# Patient Record
Sex: Female | Born: 1990 | Race: White | Hispanic: No | Marital: Single | Smoking: Never smoker
Health system: Southern US, Community
[De-identification: ages and names within clinical notes are randomized; demographics above are authoritative.]

## PROBLEM LIST (undated history)

## (undated) DIAGNOSIS — E669 Obesity, unspecified: Secondary | ICD-10-CM

## (undated) DIAGNOSIS — I10 Essential (primary) hypertension: Secondary | ICD-10-CM

## (undated) HISTORY — PX: DILATION AND CURETTAGE OF UTERUS: SHX78

## (undated) HISTORY — PX: ABDOMINAL SURGERY: SHX537

## (undated) HISTORY — PX: TONSILLECTOMY: SUR1361

---
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
## 2008-05-03 ENCOUNTER — Emergency Department: Payer: Self-pay

## 2008-05-28 ENCOUNTER — Ambulatory Visit: Payer: Self-pay | Admitting: Obstetrics and Gynecology

## 2009-04-11 ENCOUNTER — Emergency Department: Payer: Self-pay | Admitting: Emergency Medicine

## 2009-05-11 ENCOUNTER — Emergency Department: Payer: Self-pay | Admitting: Emergency Medicine

## 2010-11-23 ENCOUNTER — Emergency Department: Payer: Self-pay | Admitting: Emergency Medicine

## 2011-03-21 ENCOUNTER — Emergency Department: Payer: Self-pay | Admitting: Emergency Medicine

## 2011-03-21 LAB — COMPREHENSIVE METABOLIC PANEL
Bilirubin,Total: 0.2 mg/dL (ref 0.2–1.0)
Calcium, Total: 8.7 mg/dL (ref 8.5–10.1)
Chloride: 104 mmol/L (ref 98–107)
Co2: 26 mmol/L (ref 21–32)
Creatinine: 0.47 mg/dL — ABNORMAL LOW (ref 0.60–1.30)
EGFR (African American): >60
EGFR (Non-African Amer.): >60
Glucose: 87 mg/dL (ref 65–99)
Osmolality: 279 (ref 275–301)
Potassium: 3.5 mmol/L (ref 3.5–5.1)
SGOT(AST): 21 U/L (ref 15–37)
Sodium: 141 mmol/L (ref 136–145)

## 2011-03-21 LAB — CBC
HGB: 13 g/dL (ref 12.0–16.0)
MCH: 27.6 pg (ref 26.0–34.0)
MCHC: 33 g/dL (ref 32.0–36.0)
MCV: 84 fL (ref 80–100)
Platelet: 251 10*3/uL (ref 150–440)
RDW: 13.7 % (ref 11.5–14.5)
WBC: 10.6 10*3/uL (ref 3.6–11.0)

## 2011-03-21 LAB — URINALYSIS, COMPLETE
Bacteria: NONE SEEN
Glucose,UR: NEGATIVE mg/dL (ref 0–75)
Ketone: NEGATIVE
Nitrite: NEGATIVE
Ph: 6 (ref 4.5–8.0)
Specific Gravity: 1.015 (ref 1.003–1.030)
Squamous Epithelial: 1

## 2011-03-21 LAB — HCG, QUANTITATIVE, PREGNANCY: Beta Hcg, Quant.: 4217 m[IU]/mL — ABNORMAL HIGH

## 2011-03-25 ENCOUNTER — Emergency Department: Payer: Self-pay | Admitting: Unknown Physician Specialty

## 2011-03-25 LAB — COMPREHENSIVE METABOLIC PANEL
Albumin: 3.5 g/dL (ref 3.4–5.0)
Alkaline Phosphatase: 65 U/L (ref 50–136)
Anion Gap: 9 (ref 7–16)
BUN: 10 mg/dL (ref 7–18)
Bilirubin,Total: 0.2 mg/dL (ref 0.2–1.0)
Calcium, Total: 8.9 mg/dL (ref 8.5–10.1)
Co2: 26 mmol/L (ref 21–32)
Creatinine: 0.64 mg/dL (ref 0.60–1.30)
Glucose: 84 mg/dL (ref 65–99)
Osmolality: 278 (ref 275–301)
Potassium: 4 mmol/L (ref 3.5–5.1)
Sodium: 140 mmol/L (ref 136–145)

## 2011-03-25 LAB — CBC
HGB: 13.7 g/dL (ref 12.0–16.0)
MCH: 27.8 pg (ref 26.0–34.0)
MCHC: 33.4 g/dL (ref 32.0–36.0)
Platelet: 246 10*3/uL (ref 150–440)
RBC: 4.92 10*6/uL (ref 3.80–5.20)
WBC: 11.3 10*3/uL — ABNORMAL HIGH (ref 3.6–11.0)

## 2011-03-25 LAB — HCG, QUANTITATIVE, PREGNANCY: Beta Hcg, Quant.: 9365 m[IU]/mL — ABNORMAL HIGH

## 2012-09-22 ENCOUNTER — Emergency Department: Payer: Self-pay | Admitting: Emergency Medicine

## 2015-05-06 ENCOUNTER — Ambulatory Visit
Admission: EM | Admit: 2015-05-06 | Discharge: 2015-05-06 | Disposition: A | Discharge status: Home / Self Care | Payer: Self-pay | Attending: Family Medicine | Admitting: Family Medicine

## 2015-05-06 ENCOUNTER — Encounter: Payer: Self-pay | Admitting: Emergency Medicine

## 2015-05-06 DIAGNOSIS — L03211 Cellulitis of face: Secondary | ICD-10-CM

## 2015-05-06 DIAGNOSIS — L0100 Impetigo, unspecified: Secondary | ICD-10-CM

## 2015-05-06 MED ORDER — MUPIROCIN CALCIUM 2 % EX CREA: 1.0000 "application " | TOPICAL_CREAM | Freq: Three times a day (TID) | CUTANEOUS | Status: DC

## 2015-05-06 MED ORDER — SULFAMETHOXAZOLE-TRIMETHOPRIM 800-160 MG PO TABS: 1.0000 | ORAL_TABLET | Freq: Two times a day (BID) | ORAL | Status: DC

## 2015-05-06 NOTE — ED Notes (Signed)
Pt reports bump to right side of face that started on Monday. Pt reports has had clear drainage denies known fever

## 2015-05-06 NOTE — ED Provider Notes (Signed)
CSN: [id number]     Arrival date & time 05/06/15  1017 History   First MD Initiated Contact with Patient 05/06/15 1231     Chief Complaint  Patient presents with  . Abscess   (Consider location/radiation/quality/duration/timing/severity/associated sxs/prior Treatment) HPI Comments: 25 yo female with a c/o 3 days of right sided cheek skin lesion with surrounding, spreading, redness, tenderness and warmth. States had close exposure niece who has impetigo.  Denies fevers, chills.   Patient is a 25 y.o. female presenting with abscess. The history is provided by the patient.  Abscess   No past medical history on file. No past surgical history on file. No family history on file. Social History  Substance Use Topics  . Smoking status: Never Smoker   . Smokeless tobacco: None  . Alcohol Use: No   OB History    No data available     Review of Systems  Allergies  Hydrocodone and Vicodin  Home Medications   Prior to Admission medications   Medication Sig Start Date End Date Taking? Authorizing Provider  Multiple Vitamin (MULTIVITAMIN) tablet Take 1 tablet by mouth daily.   Yes Historical Provider, MD  mupirocin cream (BACTROBAN) 2 % Apply 1 application topically 3 (three) times daily. 05/06/15   Payton Mccallum, MD  sulfamethoxazole-trimethoprim (BACTRIM DS,SEPTRA DS) 800-160 MG tablet Take 1 tablet by mouth 2 (two) times daily. 05/06/15   Payton Mccallum, MD   Meds Ordered and Administered this Visit  Medications - No data to display  BP 145/83 mmHg  Pulse 78  Temp(Src) 98.2 F (36.8 C) (Tympanic)  Resp 16  Ht  (1.626 m)  Wt 322 lb (146.058 kg)  BMI 55.24 kg/m2  SpO2 100% No data found.   Physical Exam  Constitutional: She appears well-developed and well-nourished. No distress.  Skin: She is not diaphoretic. There is erythema.  approx 1cm honey colored, crust lesion on right upper cheek area with spreading, surrounding skin blanchable erythema, warmth and tenderness to  palpation  Nursing note and vitals reviewed.   ED Course  Procedures (including critical care time)  Labs Review Labs Reviewed - No data to display  Imaging Review No results found.   Visual Acuity Review  Right Eye Distance:   Left Eye Distance:   Bilateral Distance:    Right Eye Near:   Left Eye Near:    Bilateral Near:         MDM   1. Impetigo   2. Cellulitis, face    Discharge Medication List as of 05/06/2015 12:43 PM    START taking these medications   Details  mupirocin cream (BACTROBAN) 2 % Apply 1 application topically 3 (three) times daily., Starting 05/06/2015, Until Discontinued, Normal    sulfamethoxazole-trimethoprim (BACTRIM DS,SEPTRA DS) 800-160 MG tablet Take 1 tablet by mouth 2 (two) times daily., Starting 05/06/2015, Until Discontinued, Normal       1. diagnosis reviewed with patient  2. rx as per orders above; reviewed possible side effects, interactions, risks and benefits  3. Recommend supportive treatment with warm compresses to area 4. Follow-up prn if symptoms worsen or don't improve    Payton Mccallum, MD 05/06/15 1255

## 2015-05-07 ENCOUNTER — Telehealth: Payer: Self-pay | Admitting: *Deleted

## 2015-05-07 NOTE — ED Notes (Signed)
Pharmacy on record called to have prescription for Bactroban changed to mupirocin ointment to allow coverage by Medicaid. Verbal given to change prescription. Pharmacy will call patient.

## 2016-10-09 ENCOUNTER — Ambulatory Visit
Admission: EM | Admit: 2016-10-09 | Discharge: 2016-10-09 | Disposition: A | Discharge status: Home / Self Care | Payer: Medicaid Other | Attending: Registered Nurse | Admitting: Registered Nurse

## 2016-10-09 DIAGNOSIS — H6593 Unspecified nonsuppurative otitis media, bilateral: Secondary | ICD-10-CM

## 2016-10-09 DIAGNOSIS — H9203 Otalgia, bilateral: Secondary | ICD-10-CM

## 2016-10-09 DIAGNOSIS — R05 Cough: Secondary | ICD-10-CM

## 2016-10-09 DIAGNOSIS — J069 Acute upper respiratory infection, unspecified: Secondary | ICD-10-CM

## 2016-10-09 DIAGNOSIS — B9789 Other viral agents as the cause of diseases classified elsewhere: Secondary | ICD-10-CM

## 2016-10-09 MED ORDER — PREDNISONE 50 MG PO TABS: 50.0000 mg | ORAL_TABLET | Freq: Every day | ORAL | 0 refills | Status: AC

## 2016-10-09 MED ORDER — SALINE SPRAY 0.65 % NA SOLN: 2.0000 | NASAL | 0 refills | Status: DC

## 2016-10-09 MED ORDER — IBUPROFEN 800 MG PO TABS: 800.0000 mg | ORAL_TABLET | Freq: Three times a day (TID) | ORAL | 0 refills | Status: DC

## 2016-10-09 MED ORDER — AMOXICILLIN-POT CLAVULANATE 875-125 MG PO TABS
1.0000 | ORAL_TABLET | Freq: Two times a day (BID) | ORAL | 0 refills | Status: DC
Start: 2016-10-09 — End: 2018-08-17

## 2016-10-09 MED ORDER — BENZONATATE 100 MG PO CAPS: 100.0000 mg | ORAL_CAPSULE | Freq: Three times a day (TID) | ORAL | 0 refills | Status: DC

## 2016-10-09 MED ORDER — FLUTICASONE PROPIONATE 50 MCG/ACT NA SUSP: 1.0000 | Freq: Two times a day (BID) | NASAL | 0 refills | Status: DC

## 2016-10-09 MED ORDER — ACETAMINOPHEN 500 MG PO TABS: 1000.0000 mg | ORAL_TABLET | Freq: Four times a day (QID) | ORAL | 0 refills | Status: DC | PRN

## 2016-10-09 NOTE — ED Provider Notes (Signed)
CSN: [id number]     Arrival date & time 10/09/16  0808 History   First MD Initiated Contact with Patient 10/09/16 [phone]     Chief Complaint  Patient presents with  . URI   (Consider location/radiation/quality/duration/timing/severity/associated sxs/prior Treatment) 25y/o single caucasian female established patient here for evaluation of cough productive green, wheezing x 2 days.  Denied seasonal allergies.  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  Patient requesting work excuse next scheduled shift tomorrow. Has been sweating more than normal.  Noted chest tenderness with coughing has tried her sons breathing treatment, alkaseltzer and nyquil for cough without relief.  Hasn't used flonase in many years siblings with asthma. Wednesday her normal scheduled off day.  Mother with patient today in exam room PMHX rosacea, obesity, hypertension, pregnancy        No past medical history on file. No past surgical history on file. No family history on file. Social History  Substance Use Topics  . Smoking status: Never Smoker  . Smokeless tobacco: Never Used  . Alcohol use No   OB History    No data available     Review of Systems  Constitutional: Positive for diaphoresis. Negative for activity change, appetite change, chills, fatigue, fever and unexpected weight change.  HENT: Negative for congestion, dental problem, drooling, ear discharge, ear pain, facial swelling, hearing loss, mouth sores, nosebleeds, postnasal drip, rhinorrhea, sinus pain, sinus pressure, sneezing, sore throat, tinnitus, trouble swallowing and voice change.   Eyes: Negative for photophobia, pain, discharge, redness, itching and visual disturbance.  Respiratory: Positive for cough and wheezing. Negative for choking, chest tightness, shortness of breath and stridor.   Cardiovascular: Negative for chest pain, palpitations and leg swelling.  Gastrointestinal: Negative for  abdominal distention, abdominal pain, blood in stool, diarrhea, nausea and vomiting.  Endocrine: Negative for cold intolerance and heat intolerance.  Genitourinary: Negative for difficulty urinating, dysuria and hematuria.  Musculoskeletal: Positive for joint swelling. Negative for arthralgias, back pain, gait problem, myalgias, neck pain and neck stiffness.  Skin: Negative for color change, pallor, rash and wound.  Allergic/Immunologic: Negative for environmental allergies and food allergies.  Neurological: Negative for dizziness, tremors, seizures, syncope, facial asymmetry, speech difficulty, weakness, light-headedness, numbness and headaches.  Hematological: Negative for adenopathy. Does not bruise/bleed easily.  Psychiatric/Behavioral: Negative for agitation, behavioral problems, confusion and sleep disturbance.    Allergies  Hydrocodone and Vicodin [hydrocodone-acetaminophen]  Home Medications   Prior to Admission medications   Medication Sig Start Date End Date Taking? Authorizing Provider  acetaminophen (TYLENOL) 500 MG tablet Take 2 tablets (1,000 mg total) by mouth every 6 (six) hours as needed. 10/09/16   Nathania Waldman, Jarold Song, NP  amoxicillin-clavulanate (AUGMENTIN) 875-125 MG tablet Take 1 tablet by mouth every 12 (twelve) hours. 10/09/16   Ringo Sherod, Jarold Song, NP  benzonatate (TESSALON) 100 MG capsule Take 1 capsule (100 mg total) by mouth every 8 (eight) hours. 10/09/16   Nicko Daher, Jarold Song, NP  fluticasone (FLONASE) 50 MCG/ACT nasal spray Place 1 spray into both nostrils 2 (two) times daily. 10/09/16 12/08/16  Chavon Lucarelli, Jarold Song, NP  ibuprofen (ADVIL,MOTRIN) 800 MG tablet Take 1 tablet (800 mg total) by mouth 3 (three) times daily. 10/09/16   Tyjay Galindo, Jarold Song, NP  predniSONE (DELTASONE) 50 MG tablet Take 1 tablet (50 mg total) by mouth daily with breakfast. 10/09/16 10/14/16  Danni Leabo, Jarold Song, NP  sodium chloride (OCEAN) 0.65 % SOLN nasal spray Place  2 sprays into both nostrils every 2  (two) hours while awake. 10/09/16 11/08/16  Colbey Wirtanen, Jarold Songina A, NP   Meds Ordered and Administered this Visit  Medications - No data to display  BP (!) 153/84 (BP Location: Left Arm)   Pulse 90   Temp 98.6 F (37 C) (Oral)   Resp (!) 24   Ht 5\' 4"  (1.626 m)   Wt (!) 345 lb (156.5 kg)   LMP 09/09/2016   SpO2 98%   BMI 59.22 kg/m  No data found.   Physical Exam  Constitutional: She is oriented to person, place, and time. She appears well-developed and well-nourished. She is active and cooperative.  Non-toxic appearance. She does not have a sickly appearance. She appears ill. No distress.  HENT:  Head: Normocephalic and atraumatic.  Right Ear: Hearing, external ear and ear canal normal. A middle ear effusion is present.  Left Ear: Hearing, external ear and ear canal normal. A middle ear effusion is present.  Nose: Mucosal edema and rhinorrhea present. No nose lacerations, sinus tenderness, nasal deformity, septal deviation or nasal septal hematoma. No epistaxis.  No foreign bodies. Right sinus exhibits no maxillary sinus tenderness and no frontal sinus tenderness. Left sinus exhibits no maxillary sinus tenderness and no frontal sinus tenderness.  Mouth/Throat: Uvula is midline and mucous membranes are normal. Mucous membranes are not pale, not dry and not cyanotic. She does not have dentures. No oral lesions. No trismus in the jaw. Normal dentition. No dental abscesses, uvula swelling, lacerations or dental caries. Posterior oropharyngeal edema and posterior oropharyngeal erythema present. No oropharyngeal exudate or tonsillar abscesses.  Oropharyngeal maculopapular rash erythema; bilateral TMS air fluid level slightly opacity; cobblestoning posterior pharynx; bilateral allergic shiners; bilateral nasal turbinates edema/erythema clear discharge  Eyes: Pupils are equal, round, and reactive to light. Conjunctivae, EOM and lids are normal. Right eye exhibits no chemosis, no discharge, no exudate  and no hordeolum. No foreign body present in the right eye. Left eye exhibits no chemosis, no discharge, no exudate and no hordeolum. No foreign body present in the left eye. Right conjunctiva is not injected. Right conjunctiva has no hemorrhage. Left conjunctiva is not injected. Left conjunctiva has no hemorrhage. No scleral icterus. Right eye exhibits normal extraocular motion and no nystagmus. Left eye exhibits normal extraocular motion and no nystagmus. Right pupil is round and reactive. Left pupil is round and reactive. Pupils are equal.  Neck: Trachea normal, normal range of motion, full passive range of motion without pain and phonation normal. Neck supple. No tracheal tenderness, no spinous process tenderness and no muscular tenderness present. No neck rigidity. No tracheal deviation, no edema, no erythema and normal range of motion present. No thyroid mass and no thyromegaly present.  Cardiovascular: Normal rate, regular rhythm, S1 normal, S2 normal, normal heart sounds and intact distal pulses.  PMI is not displaced.  Exam reveals no gallop and no friction rub.   No murmur heard. Pulses:      Radial pulses are 2+ on the right side, and 2+ on the left side.       Dorsalis pedis pulses are 2+ on the right side, and 2+ on the left side.  Left ankle/foot 1+/4 nonpitting edema  Pulmonary/Chest: Effort normal. No accessory muscle usage or stridor. No respiratory distress. She has no decreased breath sounds. She has wheezes in the right upper field. She has no rhonchi. She has no rales. She exhibits no tenderness.  With nonproductive cough RUF intermittent fine wheeze; able  to speak full sentences without difficulty  Abdominal: Soft. Normal appearance. She exhibits no distension. There is no rigidity and no guarding. No hernia. Hernia confirmed negative in the ventral area.  Musculoskeletal: Normal range of motion. She exhibits edema. She exhibits no deformity.       Right shoulder: Normal.        Left shoulder: Normal.       Right elbow: Normal.      Left elbow: Normal.       Right wrist: Normal.       Left wrist: Normal.       Right hip: Normal.       Left hip: Normal.       Right knee: Normal.       Left knee: Normal.       Right ankle: Normal.       Left ankle: She exhibits swelling. She exhibits normal range of motion, no ecchymosis, no deformity, no laceration and normal pulse.       Cervical back: Normal.       Thoracic back: Normal.       Lumbar back: She exhibits tenderness and pain. She exhibits normal range of motion, no bony tenderness, no swelling, no edema, no deformity, no laceration, no spasm and normal pulse.       Back:       Right forearm: Normal.       Left forearm: Normal.       Right hand: Normal.       Left hand: Normal.  Lymphadenopathy:       Head (right side): No submental, no submandibular, no tonsillar, no preauricular, no posterior auricular and no occipital adenopathy present.       Head (left side): No submental, no submandibular, no tonsillar, no preauricular, no posterior auricular and no occipital adenopathy present.    She has no cervical adenopathy.       Right cervical: No superficial cervical, no deep cervical and no posterior cervical adenopathy present.      Left cervical: No superficial cervical, no deep cervical and no posterior cervical adenopathy present.  Neurological: She is alert and oriented to person, place, and time. She has normal strength. She is not disoriented. She displays no atrophy and no tremor. No cranial nerve deficit or sensory deficit. She exhibits normal muscle tone. She displays no seizure activity. Coordination and gait normal. GCS eye subscore is 4. GCS verbal subscore is 5. GCS motor subscore is 6.  Skin: Skin is warm, dry and intact. Capillary refill takes less than 2 seconds. Rash noted. No abrasion, no bruising, no burn, no ecchymosis, no laceration, no lesion, no petechiae and no purpura noted. Rash is macular.  Rash is not papular, not maculopapular, not nodular, not pustular, not vesicular and not urticarial. She is not diaphoretic. There is erythema. No cyanosis. No pallor. Nails show no clubbing.     Macular erythema fine scale all face except chin denied sunburn stated "this is my rosacea"  Psychiatric: She has a normal mood and affect. Her speech is normal and behavior is normal. Judgment and thought content normal. She is not actively hallucinating. Cognition and memory are normal. She is attentive.  Nursing note and vitals reviewed. negative homan's sign; on/off exam table without difficulty; smooth gait in clinic.  Urgent Care Course     Procedures (including critical care time)  Labs Review Labs Reviewed - No data to display  Imaging Review No results found.  MDM   1. Viral URI with cough   2. Otitis media with effusion, bilateral    Rx prednisone 50mg  po daily with breakfast #5 RF0 take with food.  Discussed possible side effects with patient.  Rx augmentin 875mg  po BID x 10 days #20 RF0 if no improvement with 48 hours of prednisone and flonase 1 spray each nostril BID #1 RF0.  Nasal saline 2 sprays each nostril q2h wa.  Tessalon pearles 100mg  po TID prn cough #21 RF0.  Tylenol 1000mg  po QID prn pain or motrin 800mg  po TID prn pain/fever. Honey with lemon.  Delsym OTC.  Bronchitis simple, community acquired, may have started as viral (probably respiratory syncytial, parainfluenza, influenza, or adenovirus), but now evidence of acute purulent bronchitis with resultant bronchial edema and mucus formation.  Viruses are the most common cause of bronchial inflammation in otherwise healthy adults with acute bronchitis.  The appearance of sputum is not predictive of whether a bacterial infection is present.  Purulent sputum is most often caused by viral infections.  There are a small portion of those caused by non-viral agents being Mycoplamsa pneumonia.  Microscopic examination or C&S of  sputum in the healthy adult with acute bronchitis is generally not helpful (usually negative or normal respiratory flora) other considerations being cough from upper respiratory tract infections, sinusitis or allergic syndromes (mild asthma or viral pneumonia).  Differential Diagnosis:  reactive airway disease (asthma, allergic aspergillosis (eosinophilia), chronic bronchitis, respiratory infection (Sinusitis, Common cold, pneumonia), congestive heart failure, reflux esophagitis, bronchogenic tumor, aspiration syndromes and/or exposure irritants/tobacco smoke.  In this case, there is no evidence of any invasive bacterial illness.  Most likely viral etiology so will hold on antibiotic treatment.  Advise supportive care with rest, encourage fluids, good hygiene and watch for any worsening symptoms.  If they were to develop:  come back to the office or go to the emergency room if after hours. Without high fever, severe dyspnea, lack of physical findings or other risk factors, I will hold on a chest radiograph and CBC at this time.  I discussed that approximately 50% of patients with acute bronchitis have a cough that lasts up to three weeks, and 25% for over a month.  Tylenol, one to two tablets every four hours as needed for fever or myalgias.   No aspirin.  Patient instructed to follow up in one week or sooner if symptoms worsen. Patient verbalized agreement and understanding of treatment plan.  P2:  hand washing and cover cough  Supportive treatment.   No evidence of invasive bacterial infection, non toxic and well hydrated.  This is most likely self limiting viral infection.  I do not see where any further testing or imaging is necessary at this time.   I will suggest supportive care, rest, good hygiene and encourage the patient to take adequate fluids.  The patient is to return to clinic or EMERGENCY ROOM if symptoms worsen or change significantly e.g. ear pain, fever, purulent discharge from ears or bleeding.   Exitcare handout on otitis media with effusion given to patient.  Patient verbalized agreement and understanding of treatment plan.       Barbaraann BarthelBetancourt, Skylen Spiering A, NP 10/09/16 [phone]

## 2016-10-09 NOTE — ED Triage Notes (Signed)
Pt is c/o URI sx x 4 days. She is c/o dry and productive cough of green sputum, wheezing, dyspnea, chest tightness, sore neck and abdomen/chest from coughing, some drainage. Pt has felt feverish, but no recorded temperatures. She also has rib pain, and states "it feels like I have pneumonia". 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

## 2018-08-17 ENCOUNTER — Ambulatory Visit
Admission: EM | Admit: 2018-08-17 | Discharge: 2018-08-17 | Disposition: A | Discharge status: Home / Self Care | Payer: Self-pay | Attending: Family Medicine | Admitting: Family Medicine

## 2018-08-17 ENCOUNTER — Other Ambulatory Visit: Payer: Self-pay

## 2018-08-17 ENCOUNTER — Encounter: Payer: Self-pay | Admitting: Emergency Medicine

## 2018-08-17 DIAGNOSIS — R198 Other specified symptoms and signs involving the digestive system and abdomen: Secondary | ICD-10-CM

## 2018-08-17 MED ORDER — MUPIROCIN 2 % EX OINT: 1.0000 "application " | TOPICAL_OINTMENT | Freq: Two times a day (BID) | CUTANEOUS | 0 refills | Status: AC

## 2018-08-17 MED ORDER — DOXYCYCLINE HYCLATE 100 MG PO CAPS: 100.0000 mg | ORAL_CAPSULE | Freq: Two times a day (BID) | ORAL | 0 refills | Status: DC

## 2018-08-17 NOTE — ED Provider Notes (Signed)
[facility]    CSN: [id number] Arrival date & time: 08/17/18  1538  History   Chief Complaint Chief Complaint  Patient presents with  . Abdominal Pain    (APPT) Umbilicus   HPI  28 year old female presents with pain of her umbilicus.  Patient reports a 2-day history of pain inside her umbilicus.  She reports that this morning she awoke and the area was bleeding.  It is now draining.  Appears to be draining pus.  Mild itching.  No known inciting factor.  No known exacerbating or relieving factors.  No fever.  No other associated symptoms.  No other complaints.  PMH, Surgical Hx, Family Hx, Social History reviewed and updated as below.  PMH: Depression, Morbid obesity  Past Surgical History:  Procedure Laterality Date  . ABDOMINAL SURGERY    . DILATION AND CURETTAGE OF UTERUS    . TONSILLECTOMY      OB History   No obstetric history on file.    Home Medications    Prior to Admission medications   Medication Sig Start Date End Date Taking? Authorizing Provider  acetaminophen (TYLENOL) 500 MG tablet Take 2 tablets (1,000 mg total) by mouth every 6 (six) hours as needed. 10/09/16  Yes Betancourt, Jarold Songina A, NP  doxycycline (VIBRAMYCIN) 100 MG capsule Take 1 capsule (100 mg total) by mouth 2 (two) times daily. 08/17/18   Tommie Samsook, Delvis Kau G, DO  mupirocin ointment (BACTROBAN) 2 % Apply 1 application topically 2 (two) times daily for 7 days. 08/17/18 08/24/18  Tommie Samsook, Laurel Harnden G, DO   Family History Family History  Problem Relation Age of Onset  . Crohn's disease Mother   . COPD Father   . Heart failure Father    Social History Social History   Tobacco Use  . Smoking status: Never Smoker  . Smokeless tobacco: Never Used  Substance Use Topics  . Alcohol use: No  . Drug use: Not Currently   Allergies   Hydrocodone and Vicodin [hydrocodone-acetaminophen]   Review of Systems Review of Systems  Constitutional: Negative for fever.  Skin:       Pain inside umbilicus;  drainage.   Physical Exam Triage Vital Signs ED Triage Vitals  Enc Vitals Group     BP 08/17/18 1610 (!) 144/84     Pulse Rate 08/17/18 1610 75     Resp 08/17/18 1610 18     Temp 08/17/18 1610 98.7 F (37.1 C)     Temp Source 08/17/18 1610 Oral     SpO2 08/17/18 1610 98 %     Weight 08/17/18 1605 (!) 350 lb (158.8 kg)     Height 08/17/18 1605 5\' 4"  (1.626 m)     Head Circumference --      Peak Flow --      Pain Score 08/17/18 1605 7     Pain Loc --      Pain Edu? --      Excl. in GC? --    Updated Vital Signs BP (!) 144/84 (BP Location: Right Arm)   Pulse 75   Temp 98.7 F (37.1 C) (Oral)   Resp 18   Ht 5\' 4"  (1.626 m)   Wt (!) 158.8 kg   LMP 06/15/2018 (Approximate)   SpO2 98%   BMI 60.08 kg/m   Visual Acuity Right Eye Distance:   Left Eye Distance:   Bilateral Distance:    Right Eye Near:   Left Eye Near:    Bilateral Near:  Physical Exam Vitals signs and nursing note reviewed.  Constitutional:      General: She is not in acute distress.    Appearance: She is well-developed. She is obese.  HENT:     Head: Normocephalic and atraumatic.  Eyes:     General:        Right eye: No discharge.        Left eye: No discharge.     Conjunctiva/sclera: Conjunctivae normal.  Cardiovascular:     Rate and Rhythm: Normal rate and regular rhythm.  Pulmonary:     Effort: Pulmonary effort is normal.     Breath sounds: Normal breath sounds.  Skin:    Comments: Erythema noted inside the umbilicus and purulent discharge noted.  Neurological:     Mental Status: She is alert.  Psychiatric:        Behavior: Behavior normal.     Comments: Flat affect.    UC Treatments / Results  Labs (all labs ordered are listed, but only abnormal results are displayed) Labs Reviewed - No data to display  EKG None  Radiology No results found.  Procedures Procedures (including critical care time)  Medications Ordered in UC Medications - No data to display  Initial  Impression / Assessment and Plan / UC Course  I have reviewed the triage vital signs and the nursing notes.  Pertinent labs & imaging results that were available during my care of the patient were reviewed by me and considered in my medical decision making (see chart for details).    28 year old female presents with infection of her umbilicus.  Treating with doxycycline and Bactroban ointment.  Final Clinical Impressions(s) / UC Diagnoses   Final diagnoses:  Umbilicus discharge     Discharge Instructions     Antibiotics as prescribed.  Take care  Dr. Adriana Simas    ED Prescriptions    Medication Sig Dispense Auth. Provider   doxycycline (VIBRAMYCIN) 100 MG capsule Take 1 capsule (100 mg total) by mouth 2 (two) times daily. 14 capsule Mishel Sans G, DO   mupirocin ointment (BACTROBAN) 2 % Apply 1 application topically 2 (two) times daily for 7 days. 22 g Tommie Sams, DO     Controlled Substance Prescriptions [place] Controlled Substance Registry consulted? Not Applicable   Tommie Sams, DO 08/17/18 1702

## 2018-08-17 NOTE — Discharge Instructions (Signed)
Antibiotics as prescribed.  Take care  Dr. Vanetta Rule  

## 2018-08-17 NOTE — ED Triage Notes (Signed)
Pt c/o pain in her umbilicus. Started about 3 days ago. She states this morning when she woke up it was bleeding. She states that it was itching this morning.

## 2018-11-20 ENCOUNTER — Other Ambulatory Visit: Payer: Self-pay

## 2018-11-20 DIAGNOSIS — Z20822 Contact with and (suspected) exposure to covid-19: Secondary | ICD-10-CM

## 2018-11-22 LAB — NOVEL CORONAVIRUS, NAA: SARS-CoV-2, NAA: NOT DETECTED

## 2019-04-01 ENCOUNTER — Other Ambulatory Visit: Payer: Self-pay

## 2019-04-01 ENCOUNTER — Ambulatory Visit
Admission: EM | Admit: 2019-04-01 | Discharge: 2019-04-01 | Disposition: A | Discharge status: Home / Self Care | Payer: Medicaid Other | Attending: Emergency Medicine | Admitting: Emergency Medicine

## 2019-04-01 DIAGNOSIS — B349 Viral infection, unspecified: Secondary | ICD-10-CM | POA: Diagnosis not present

## 2019-04-01 DIAGNOSIS — Z7189 Other specified counseling: Secondary | ICD-10-CM | POA: Diagnosis present

## 2019-04-01 DIAGNOSIS — R519 Headache, unspecified: Secondary | ICD-10-CM

## 2019-04-01 DIAGNOSIS — R110 Nausea: Secondary | ICD-10-CM

## 2019-04-01 DIAGNOSIS — Z20822 Contact with and (suspected) exposure to covid-19: Secondary | ICD-10-CM | POA: Diagnosis present

## 2019-04-01 DIAGNOSIS — R0981 Nasal congestion: Secondary | ICD-10-CM

## 2019-04-01 DIAGNOSIS — R5383 Other fatigue: Secondary | ICD-10-CM

## 2019-04-01 HISTORY — DX: Obesity, unspecified: E66.9

## 2019-04-01 HISTORY — DX: Essential (primary) hypertension: I10

## 2019-04-01 NOTE — ED Triage Notes (Addendum)
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 Work wants her checked for COVID.

## 2019-04-01 NOTE — Discharge Instructions (Signed)
It was very nice seeing you today in clinic. Thank you for entrusting me with your care.   Rest and Stay HYDRATED. Water and electrolyte containing beverages (Gatorade, Pedialyte) are best to prevent dehydration and electrolyte abnormalities. May use Tylenol and/or Ibuprofen as needed for pain.   You were tested for SARS-CoV-2 (novel coronavirus) today. Testing is performed by an outside lab ([facility]) and has variable turn around times ranging between 2-5 days. Current recommendations from the the CDC and [place] DHHS require that you remain out of work in order to quarantine at home until negative test results are have been received. In the event that your test results are positive, you will be contacted with further directives. These measures are being implemented out of an abundance of caution to prevent transmission and spread during the current SARS-CoV-2 pandemic.  Make arrangements to follow up with your regular doctor in 1 week for re-evaluation if not improving. If your symptoms/condition worsens, please seek follow up care either here or in the ER. Please remember, our [facility] providers are "right here with you" when you need [place].   Again, it was my pleasure to take care of you today. Thank you for choosing our clinic. I hope that you start to feel better quickly.   Quentin Mulling, MSN, APRN, FNP-C, CEN Advanced Practice Provider [facility]

## 2019-04-02 ENCOUNTER — Other Ambulatory Visit: Payer: Medicaid Other

## 2019-04-02 LAB — NOVEL CORONAVIRUS, NAA (HOSP ORDER, SEND-OUT TO REF LAB; TAT 18-24 HRS): SARS-CoV-2, NAA: NOT DETECTED

## 2019-04-03 NOTE — ED Provider Notes (Signed)
[place] [place]   Name: Alexandria Black DOB: [birth year] MRN: [id number] CSN: [id number] PCP: Patient, No Pcp Per  Arrival date and time:  04/01/19 1531  Chief Complaint:  Headache and Sore Throat   NOTE: Prior to seeing the patient today, I have reviewed the triage nursing documentation and vital signs. Clinical staff has updated patient's PMH/PSHx, current medication list, and drug allergies/intolerances to ensure comprehensive history available to assist in medical decision making.   History:   HPI: Alexandria Black is a 29 y.o. female who presents today with complaints of fatigue, congestion, rhinorrhea, and a generalized headache that started approximately 3 days ago. Patient denies fevers. She denies any cough, shortness of breath, or wheezing. She has had some mild nausea, but denies vomiting, diarrhea, or abdominal pain. She is eating and drinking well. Patient denies any perceived alterations to her sense of taste or smell. Patient presents out of concerns for her personal health due to her progressive symptoms. She notes that her nieces and nephews have been ill with similar symptoms, however all of them have tested negative for SARS-CoV-2 (novel coronavirus). No one else is her home has experienced a similar symptom constellation. She has not been tested for SARS-CoV-2 (novel coronavirus) in the past 14 days; last tested negative on 11/20/2018 per her report. Patient has been vaccinated for influenza this season. In efforts to conservatively manage her symptoms at home, the patient notes that she has used IBU and APAP, which have helped to improve her symptoms some.  Past Medical History:  Diagnosis Date  . Hypertension   . Obesity     Past Surgical History:  Procedure Laterality Date  . ABDOMINAL SURGERY    . DILATION AND CURETTAGE OF UTERUS    . TONSILLECTOMY      Family History  Problem Relation Age of Onset  . Crohn's disease Mother   . COPD Father   . Heart failure  Father     Social History   Tobacco Use  . Smoking status: Never Smoker  . Smokeless tobacco: Never Used  Substance Use Topics  . Alcohol use: No  . Drug use: Not Currently    There are no problems to display for this patient.   Home Medications:    No outpatient medications have been marked as taking for the 04/01/19 encounter [employer] Encounter).    Allergies:   Hydrocodone and Vicodin [hydrocodone-acetaminophen]  Review of Systems (ROS): Review of Systems  Constitutional: Positive for fatigue. Negative for fever.  HENT: Positive for congestion and rhinorrhea. Negative for ear pain, postnasal drip, sinus pressure, sinus pain, sneezing and sore throat.   Eyes: Negative for pain, discharge and redness.  Respiratory: Negative for cough, chest tightness and shortness of breath.   Cardiovascular: Negative for chest pain and palpitations.  Gastrointestinal: Positive for nausea. Negative for abdominal pain, diarrhea and vomiting.  Musculoskeletal: Negative for arthralgias, back pain, myalgias and neck pain.  Skin: Negative for color change, pallor and rash.  Neurological: Positive for headaches. Negative for dizziness, syncope and weakness.  Hematological: Negative for adenopathy.     Vital Signs: Today's Vitals   04/01/19 1623 04/01/19 1625  BP: (!) 150/92   Pulse: 92   Resp: 20   Temp: 98.1 F (36.7 C)   TempSrc: Oral   SpO2: 99%   Weight:  (!) 355 lb (161 kg)  Height:  5\' 4"  (1.626 m)  PainSc: 4      Physical Exam: Physical Exam  Constitutional: She  is oriented to person, place, and time and well-developed, well-nourished, and in no distress.  HENT:  Head: Normocephalic and atraumatic.  Nose: Rhinorrhea present. No sinus tenderness.  Mouth/Throat: Uvula is midline and mucous membranes are normal. Posterior oropharyngeal erythema (mild with (+) clear PND) present. No oropharyngeal exudate or posterior oropharyngeal edema.  Eyes: Pupils are equal, round, and  reactive to light.  Cardiovascular: Normal rate, regular rhythm, normal heart sounds and intact distal pulses.  Pulmonary/Chest: Effort normal and breath sounds normal.  Neurological: She is alert and oriented to person, place, and time. Gait normal.  Skin: Skin is warm and dry. No rash noted. She is not diaphoretic.  Psychiatric: Mood, memory, affect and judgment normal.  Nursing note and vitals reviewed.   Urgent Care Treatments / Results:   Orders Placed This Encounter  Procedures  . Novel Coronavirus, NAA (Hosp order, Send-out to Ref Lab; TAT 18-24 hrs    LABS: PLEASE NOTE: all labs that were ordered this encounter are listed, however only abnormal results are displayed. Labs Reviewed  NOVEL CORONAVIRUS, NAA (HOSP ORDER, SEND-OUT TO REF LAB; TAT 18-24 HRS)    EKG: -None  RADIOLOGY: No results found.  PROCEDURES: Procedures  MEDICATIONS RECEIVED THIS VISIT: Medications - No data to display  PERTINENT CLINICAL COURSE NOTES/UPDATES:   Initial Impression / Assessment and Plan / Urgent Care Course:  Pertinent labs & imaging results that were available during my care of the patient were personally reviewed by me and considered in my medical decision making (see lab/imaging section of note for values and interpretations).  Alexandria Black is a 29 y.o. female who presents to [facility] today with complaints of Headache and Sore Throat  Patient overall well appearing and in no acute distress today in clinic. Presenting symptoms (see HPI) and exam as documented above. She presents with symptoms associated with SARS-CoV-2 (novel coronavirus). Discussed typical symptom constellation. Reviewed potential for infection and need for testing. Patient amenable to being tested. SARS-CoV-2 swab collected by certified clinical staff. Discussed variable turn around times associated with testing, as swabs are being processed at [facility], and have been taking between 24-48 hours to come  back. She was advised to self quarantine, per [facility] DHHS guidelines, until negative results received. These measures are being implemented out of an abundance of caution to prevent transmission and spread during the current SARS-CoV-2 pandemic.  Presenting symptoms consistent with acute viral illness. Until ruled out with confirmatory lab testing, SARS-CoV-2 remains part of the differential. Her testing is pending at this time. I discussed with her that her symptoms are felt to be viral in nature, thus antibiotics would not offer her any relief or improve his symptoms any faster than conservative symptomatic management. Discussed supportive care measures at home during acute phase of illness. Patient to rest as much as possible. She was encouraged to ensure adequate hydration (water and ORS) to prevent dehydration and electrolyte derangements. Patient may use APAP and/or IBU on an as needed basis for pain/fever.    Discussed follow up with primary care physician in 1 week for re-evaluation. I have reviewed the follow up and strict return precautions for any new or worsening symptoms. Patient is aware of symptoms that would be deemed urgent/emergent, and would thus require further evaluation either here or in the emergency department. At the time of discharge, she verbalized understanding and consent with the discharge plan as it was reviewed with her. All questions were fielded by provider and/or clinic staff prior  to patient discharge.    Final Clinical Impressions / Urgent Care Diagnoses:   Final diagnoses:  Viral illness  Encounter for laboratory testing for COVID-19 virus  Advice given about COVID-19 virus infection    New Prescriptions:  [place] Controlled Substance Registry consulted? Not Applicable  No orders of the defined types were placed in this encounter.   Recommended Follow up Care:  Patient encouraged to follow up with the following provider within the specified time frame, or sooner as  dictated by the severity of her symptoms. As always, she was instructed that for any urgent/emergent care needs, she should seek care either here or in the emergency department for more immediate evaluation.  Follow-up Information    PCP In 1 week.   Why: General reassessment of symptoms if not improving        NOTE: This note was prepared using Lobbyist along with smaller Company secretary. Despite my best ability to proofread, there is the potential that transcriptional errors may still occur from this process, and are completely unintentional.    Karen Kitchens, NP 04/03/19 1442

## 2019-04-15 ENCOUNTER — Other Ambulatory Visit: Payer: Medicaid Other

## 2019-04-24 ENCOUNTER — Ambulatory Visit
Admission: EM | Admit: 2019-04-24 | Discharge: 2019-04-24 | Disposition: A | Discharge status: Home / Self Care | Payer: HRSA Program | Attending: Urgent Care | Admitting: Urgent Care

## 2019-04-24 ENCOUNTER — Ambulatory Visit: Payer: Self-pay

## 2019-04-24 ENCOUNTER — Other Ambulatory Visit: Payer: Self-pay

## 2019-04-24 ENCOUNTER — Ambulatory Visit
Admission: RE | Admit: 2019-04-24 | Discharge: 2019-04-24 | Disposition: A | Discharge status: Home / Self Care | Payer: Medicaid Other | Source: Ambulatory Visit

## 2019-04-24 DIAGNOSIS — U071 COVID-19: Secondary | ICD-10-CM

## 2019-04-24 DIAGNOSIS — J1282 Pneumonia due to coronavirus disease 2019: Secondary | ICD-10-CM | POA: Insufficient documentation

## 2019-04-24 DIAGNOSIS — R079 Chest pain, unspecified: Secondary | ICD-10-CM

## 2019-04-24 DIAGNOSIS — R05 Cough: Secondary | ICD-10-CM | POA: Insufficient documentation

## 2019-04-24 DIAGNOSIS — R059 Cough, unspecified: Secondary | ICD-10-CM

## 2019-04-24 DIAGNOSIS — R0602 Shortness of breath: Secondary | ICD-10-CM

## 2019-04-24 MED ORDER — HYDROCOD POLST-CPM POLST ER 10-8 MG/5ML PO SUER: 5.0000 mL | Freq: Two times a day (BID) | ORAL | 0 refills | Status: DC | PRN

## 2019-04-24 MED ORDER — PREDNISONE 20 MG PO TABS: 40.0000 mg | ORAL_TABLET | Freq: Every day | ORAL | 0 refills | Status: DC

## 2019-04-24 NOTE — ED Provider Notes (Signed)
Virtual Visit via Video Note:  Alexandria Black  initiated request for Telemedicine visit with [facility] team. I connected with Alexandria Black  on 04/24/2019 at 10:58 AM  for a synchronized telemedicine visit using a video enabled HIPPA compliant telemedicine application. I verified that I am speaking with Alexandria Black  using two identifiers. Janace Aris, NP  was physically located in a [facility]gent care site and IYA HAMED was located at a different location.   The limitations of evaluation and management by telemedicine as well as the availability of in-person appointments were discussed. Patient was informed that she  may incur a bill ( including co-pay) for this virtual visit encounter. Lilley N Bernier  expressed understanding and gave verbal consent to proceed with virtual visit.     History of Present Illness:Alexandria Black  is a 29 y.o. female presents with chest pain with taking a deep breath and SOB. SOB is worse at night and with exertion. Mostly dry cough. She was dx with COVID 19 on the 29th of January. Feels as if her symptoms are worsening. No fever.   Past Medical History:  Diagnosis Date   Hypertension    Obesity     Allergies  Allergen Reactions   Hydrocodone    Vicodin [Hydrocodone-Acetaminophen]         Observations/Objective: VITALS: Per patient if applicable, see vitals. GENERAL: Alert, appears well and in no acute distress. HEENT: Atraumatic, conjunctiva clear, no obvious abnormalities on inspection of external nose and ears. NECK: Normal movements of the head and neck. CARDIOPULMONARY: No increased WOB. Speaking in clear sentences. I:E ratio WNL.  MS: Moves all visible extremities without noticeable abnormality. PSYCH: Pleasant and cooperative, well-groomed. Speech normal rate and rhythm. Affect is appropriate. Insight and judgement are appropriate. Attention is focused, linear, and appropriate.  NEURO: CN grossly intact. Oriented as  arrived to appointment on time with no prompting. Moves both UE equally.  SKIN: No obvious lesions, wounds, erythema, or cyanosis noted on face or hands.    Assessment and Plan: pt in no acute distress on camera. My concern is the chest pain with breathing and SOB with dx of COVID 19. She needs to be evaluated in person for possible x ray and vitals check to r/o hypoxia.  Pt understanding and agreed to plan.     Follow Up Instructions: go to the [facility] to be evaluated.     I discussed the assessment and treatment plan with the patient. The patient was provided an opportunity to ask questions and all were answered. The patient agreed with the plan and demonstrated an understanding of the instructions.   The patient was advised to call back or seek an in-person evaluation if the symptoms worsen or if the condition fails to improve as anticipated.    Janace Aris, NP  04/24/2019 10:58 AM         Janace Aris, NP 04/24/19 1114

## 2019-04-24 NOTE — ED Provider Notes (Signed)
[place], [place]   Name: Alexandria Black DOB: Nov 17, 1990 MRN: [id number] CSN: [id number] PCP: Patient, No Pcp Per  Arrival date and time:  04/24/19 1142  Chief Complaint:  Shortness of Breath   NOTE: Prior to seeing the patient today, I have reviewed the triage nursing documentation and vital signs. Clinical staff has updated patient's PMH/PSHx, current medication list, and drug allergies/intolerances to ensure comprehensive history available to assist in medical decision making.   History:   HPI: Alexandria Black is a 29 y.o. female who presents today with complaints of cough, shortness of breath, and pleuritic chest pain for the last 2 weeks. Patient denies and fevers. She has not experienced any wheezing. Cough reported as being non-productive and worse at night. Patient reporting that she was diagnosed with SARS-CoV-2 (novel coronavirus) on 04/15/2019. With the exception of the aforementioned symptoms, patient reports that she has improved since being diagnosed with SARS-CoV-2. She is no longer having headaches, nausea, vomiting, and body aches. Patient continues to experience anosmia and ageusia. In efforts to conservatively manage her symptoms at home, the patient notes that she has used Nyquil, Dayquil, and IBU, which has not helped to improve her symptoms. Patient with no increased work of breathing or respiratory distress observed in clinic. She is able to speak in complete sentences without difficulties. Presenting oxygen saturations 98% on room air. Patient participated in a tele-health visit today with Jaci Lazier, NP; notes reviewed. Due to her symptoms, it was recommended that patient present for F2F evaluation and +/- imaging of her chest.   Past Medical History:  Diagnosis Date  . Hypertension   . Obesity     Past Surgical History:  Procedure Laterality Date  . ABDOMINAL SURGERY    . DILATION AND CURETTAGE OF UTERUS    . TONSILLECTOMY      Family History  Problem Relation Age of  Onset  . Crohn's disease Mother   . COPD Father   . Heart failure Father     Social History   Tobacco Use  . Smoking status: Never Smoker  . Smokeless tobacco: Never Used  Substance Use Topics  . Alcohol use: No  . Drug use: Not Currently    There are no problems to display for this patient.   Home Medications:    No outpatient medications have been marked as taking for the 04/24/19 encounter [facility] Encounter).    Allergies:   Hydrocodone and Vicodin [hydrocodone-acetaminophen]  Review of Systems (ROS): Review of Systems  Constitutional: Negative for fatigue and fever.  HENT: Negative for congestion, ear pain, postnasal drip, rhinorrhea, sinus pressure, sinus pain, sneezing and sore throat.        (+) anosmia and ageusia  Eyes: Negative for pain, discharge and redness.  Respiratory: Positive for cough and shortness of breath. Negative for chest tightness.   Cardiovascular: Positive for chest pain (pleuritic). Negative for palpitations.  Gastrointestinal: Negative for abdominal pain, diarrhea, nausea and vomiting.  Musculoskeletal: Negative for arthralgias, back pain, myalgias and neck pain.  Skin: Negative for color change, pallor and rash.  Neurological: Negative for dizziness, syncope, weakness and headaches.  Hematological: Negative for adenopathy.  Psychiatric/Behavioral: Positive for sleep disturbance.     Vital Signs: Today's Vitals   04/24/19 1158 04/24/19 1200 04/24/19 1303  BP:  (!) 147/76   Pulse:  84   Temp:  98.7 F (37.1 C)   TempSrc:  Oral   SpO2:  98%   Weight: (!) 355 lb (161 kg)  Height: 5\' 4"  (1.626 m)    PainSc: 7   7     Physical Exam: Physical Exam  Constitutional: She is oriented to person, place, and time and well-developed, well-nourished, and in no distress.  HENT:  Head: Normocephalic and atraumatic.  Nose: Nose normal.  Mouth/Throat: Uvula is midline and mucous membranes are normal. Posterior oropharyngeal erythema (mild)  present. No oropharyngeal exudate or posterior oropharyngeal edema.  Eyes: Pupils are equal, round, and reactive to light.  Cardiovascular: Normal rate, regular rhythm, normal heart sounds and intact distal pulses.  Pulmonary/Chest: Effort normal. She has decreased breath sounds in the right middle field and the left upper field. She has rhonchi (scattered; clears with cough). She exhibits no tenderness.  Moderate cough noted in clinic. No SOB or increased WOB. No distress. Able to speak in complete sentences without difficulties. SPO2 98% on RA.  Neurological: She is alert and oriented to person, place, and time. Gait normal.  Skin: Skin is warm and dry. No rash noted. She is not diaphoretic.  Psychiatric: Mood, memory, affect and judgment normal.  Nursing note and vitals reviewed.   Urgent Care Treatments / Results:   Orders Placed This Encounter  Procedures  . DG Chest 2 View    LABS: PLEASE NOTE: all labs that were ordered this encounter are listed, however only abnormal results are displayed. Labs Reviewed - No data to display  EKG: -None  RADIOLOGY: DG Chest 2 View  Result Date: 04/24/2019 CLINICAL DATA:  Cough. Dyspnea on exertion. SARS-CoV-2 positive 04/15/2019 EXAM: CHEST - 2 VIEW COMPARISON:  Radiograph 10/30/2007 FINDINGS: The cardiomediastinal contours are normal. Minimal vague patchy opacities in the mid lower lung zones bilaterally. Pulmonary vasculature is normal. No pleural effusion or pneumothorax. No acute osseous abnormalities are seen. IMPRESSION: Minimal vague patchy opacities in the mid lower lung zones bilaterally, suspicious for atypical viral pneumonia. Radiographic findings are mild. Electronically Signed   By: 11/01/2007 M.D.   On: 04/24/2019 12:41    PROCEDURES: Procedures  MEDICATIONS RECEIVED THIS VISIT: Medications - No data to display  PERTINENT CLINICAL COURSE NOTES/UPDATES:   Initial Impression / Assessment and Plan / Urgent Care Course:   Pertinent labs & imaging results that were available during my care of the patient were personally reviewed by me and considered in my medical decision making (see lab/imaging section of note for values and interpretations).  Alexandria Black is a 29 y.o. female who presents to [facility] today with complaints of Shortness of Breath  Patient is well appearing overall in clinic today. She does not appear to be in any acute distress. Presenting symptoms (see HPI) and exam as documented above. Progressive cough and shortness of breath following SARS-CoV-2 (novel coronavirus) on 04/16/2019. No fevers. Radiographs of the chest performed today reveal patchy opacities BILATERALLY consistent with SARS-CoV-2 pneumonia. I discussed with her that her symptoms are felt to be viral in nature, thus antibiotics would not offer her any relief or improve her symptoms any faster than conservative symptomatic management. Will treat with systemic steroids to help with cough and pleuritic chest pain. Cough is significant and preventing sleep. Will send in a supply of Tussionex for PRN use. She was educated on the indications and associated side effects of this medication. Discussed supportive care measures at home during acute phase of illness. Patient to rest as much as possible. She was encouraged to ensure adequate hydration (water and ORS) to prevent dehydration and electrolyte derangements. Patient may use APAP  and/or IBU on an as needed basis for pain/fever. Patient remains out of work due to required SARS-CoV-2 quarantine at this time.   Discussed follow up with primary care physician in 1 week for re-evaluation. I have reviewed the follow up and strict return precautions for any new or worsening symptoms. Patient is aware of symptoms that would be deemed urgent/emergent, and would thus require further evaluation either here or in the emergency department. At the time of discharge, she verbalized understanding and  consent with the discharge plan as it was reviewed with her. All questions were fielded by provider and/or clinic staff prior to patient discharge.    Final Clinical Impressions / Urgent Care Diagnoses:   Final diagnoses:  Pneumonia due to COVID-19 virus  SOB (shortness of breath)  Cough    New Prescriptions:  [place] Controlled Substance Registry consulted? Yes, I have consulted the [place] Controlled Substances Registry for this patient, and feel the risk/benefit ratio today is favorable for proceeding with this prescription for a controlled substance.  . Discussed use of controlled substance medication to treat her acute symptoms.  o Reviewed [place] STOP Act regulations  o Clinic does not refill controlled substances over the phone without face to face evaluation.  . Safety precautions reviewed.  o Medications should not be sold or taken with alcohol.  o Avoid use while working, driving, or operating heavy machinery.  o Side effects associated with the use of this particular medication reviewed. - Patient understands that this medication can cause CNS depression, increase her risk of falls, and even lead to overdose that may result in death, if used outside of the parameters that she and I discussed.  With all of this in mind, she knowingly accepts the risks and responsibilities associated with intended course of treatment, and elects to responsibly proceed as discussed.  Meds ordered this encounter  Medications  . chlorpheniramine-HYDROcodone (TUSSIONEX PENNKINETIC ER) 10-8 MG/5ML SUER    Sig: Take 5 mLs by mouth every 12 (twelve) hours as needed for cough. Will causes drowsiness; NO DRIVING.    Dispense:  70 mL    Refill:  0  . predniSONE (DELTASONE) 20 MG tablet    Sig: Take 2 tablets (40 mg total) by mouth daily.    Dispense:  10 tablet    Refill:  0    Recommended Follow up Care:  Patient encouraged to follow up with the following provider within the specified time frame, or sooner as  dictated by the severity of her symptoms. As always, she was instructed that for any urgent/emergent care needs, she should seek care either here or in the emergency department for more immediate evaluation.  Follow-up Information    PCP In 1 week.   Why: General reassessment of symptoms if not improving        NOTE: This note was prepared using Lobbyist along with smaller Company secretary. Despite my best ability to proofread, there is the potential that transcriptional errors may still occur from this process, and are completely unintentional.    Karen Kitchens, NP 04/25/19 1503

## 2019-04-24 NOTE — Discharge Instructions (Signed)
It was very nice seeing you today in clinic. Thank you for entrusting me with your care.   Rest and Stay HYDRATED. Water and electrolyte containing beverages (Gatorade, Pedialyte) are best to prevent dehydration and electrolyte abnormalities. Please utilize the medications that we discussed. Your prescriptions has been called in to your pharmacy.   Try to sleep on stomach (prone) in order to promote lung expansion. Make sure you are deep breathing to expand lungs. Monitor for fevers. If not improving, return call to the clinic.   Make arrangements to follow up with your regular doctor in 1 week for re-evaluation if not improving. If your symptoms/condition worsens, please seek follow up care either here or in the ER. Please remember, our [facility] providers are "right here with you" when you need [place].   Again, it was my pleasure to take care of you today. Thank you for choosing our clinic. I hope that you start to feel better quickly.   Quentin Mulling, MSN, APRN, FNP-C, CEN Advanced Practice Provider [facility] [place] [facility]

## 2019-04-24 NOTE — Discharge Instructions (Addendum)
Go to the urgent care for further evaluation in person.

## 2019-04-24 NOTE — ED Triage Notes (Signed)
Pt presents with c/o cough and shob x5d. She reports increased shob w/exertion. She recently tested positive to COVID 04/15/19 ([facility]).

## 2019-06-21 ENCOUNTER — Encounter: Payer: Self-pay | Admitting: Emergency Medicine

## 2019-06-21 ENCOUNTER — Other Ambulatory Visit: Payer: Self-pay

## 2019-06-21 ENCOUNTER — Ambulatory Visit
Admission: EM | Admit: 2019-06-21 | Discharge: 2019-06-21 | Disposition: A | Discharge status: Home / Self Care | Payer: Medicaid Other | Attending: Family Medicine | Admitting: Family Medicine

## 2019-06-21 DIAGNOSIS — M79672 Pain in left foot: Secondary | ICD-10-CM

## 2019-06-21 DIAGNOSIS — B070 Plantar wart: Secondary | ICD-10-CM

## 2019-06-21 NOTE — ED Triage Notes (Signed)
Patient c/o left foot pain that has gotten better over the year.  Patient states that she has plantar warts on the bottom of her left foot and the pain has gotten worse over the past 2 months.

## 2019-07-03 NOTE — ED Provider Notes (Signed)
[facility]    CSN: [id number] Arrival date & time: 06/21/19  9528      History   Chief Complaint Chief Complaint  Patient presents with  . Foot Pain    left    HPI Alexandria Black is a 29 y.o. female.   29 yo female with a c/o left foot pain to the bottom of her foot for the past 2 years but has worsened over the past 2 months. State pain is at the bottom of the foot where there's a skin lesion. Denies any traumatic injury.    Foot Pain    Past Medical History:  Diagnosis Date  . Hypertension   . Obesity     There are no problems to display for this patient.   Past Surgical History:  Procedure Laterality Date  . ABDOMINAL SURGERY    . DILATION AND CURETTAGE OF UTERUS    . TONSILLECTOMY      OB History   No obstetric history on file.      Home Medications    Prior to Admission medications   Medication Sig Start Date End Date Taking? Authorizing Provider  acetaminophen (TYLENOL) 500 MG tablet Take 2 tablets (1,000 mg total) by mouth every 6 (six) hours as needed. 10/09/16   Betancourt, Aura Fey, NP  chlorpheniramine-HYDROcodone (TUSSIONEX PENNKINETIC ER) 10-8 MG/5ML SUER Take 5 mLs by mouth every 12 (twelve) hours as needed for cough. Will causes drowsiness; NO DRIVING. 06/25/22   Karen Kitchens, NP  predniSONE (DELTASONE) 20 MG tablet Take 2 tablets (40 mg total) by mouth daily. 04/24/19   Karen Kitchens, NP    Family History Family History  Problem Relation Age of Onset  . Crohn's disease Mother   . COPD Father   . Heart failure Father     Social History Social History   Tobacco Use  . Smoking status: Never Smoker  . Smokeless tobacco: Never Used  Substance Use Topics  . Alcohol use: No  . Drug use: Not Currently     Allergies   Patient has no active allergies.   Review of Systems Review of Systems   Physical Exam Triage Vital Signs ED Triage Vitals  Enc Vitals Group     BP 06/21/19 0833 (!) 145/93     Pulse Rate 06/21/19  0833 79     Resp 06/21/19 0833 16     Temp 06/21/19 0833 97.8 F (36.6 C)     Temp Source 06/21/19 0833 Oral     SpO2 06/21/19 0833 98 %     Weight 06/21/19 0829 (!) 355 lb (161 kg)     Height 06/21/19 0829 5\' 4"  (1.626 m)     Head Circumference --      Peak Flow --      Pain Score 06/21/19 0829 7     Pain Loc --      Pain Edu? --      Excl. in [place]? --    No data found.  Updated Vital Signs BP (!) 145/93 (BP Location: Right Arm)   Pulse 79   Temp 97.8 F (36.6 C) (Oral)   Resp 16   Ht 5\' 4"  (1.626 m)   Wt (!) 161 kg   LMP 05/31/2019 (Approximate)   SpO2 98%   BMI 60.94 kg/m   Visual Acuity Right Eye Distance:   Left Eye Distance:   Bilateral Distance:    Right Eye Near:   Left Eye Near:  Bilateral Near:     Physical Exam Vitals and nursing note reviewed.  Constitutional:      General: She is not in acute distress.    Appearance: She is not toxic-appearing or diaphoretic.  Musculoskeletal:     Left foot: Normal pulse.     Comments: Left foot with large plantar wart tender to palpation; no redness, drainage or other lesions  Neurological:     Mental Status: She is alert.      UC Treatments / Results  Labs (all labs ordered are listed, but only abnormal results are displayed) Labs Reviewed - No data to display  EKG   Radiology No results found.  Procedures Procedures (including critical care time)  Medications Ordered in UC Medications - No data to display  Initial Impression / Assessment and Plan / UC Course  I have reviewed the triage vital signs and the nursing notes.  Pertinent labs & imaging results that were available during my care of the patient were reviewed by me and considered in my medical decision making (see chart for details).      Final Clinical Impressions(s) / UC Diagnoses   Final diagnoses:  Plantar wart, left foot  Foot pain, left    ED Prescriptions    None      1. diagnosis reviewed with patient 2.  Supportive treatment with otc wart medications 3. Follow up with podiatrist  PDMP not reviewed this encounter.   Payton Mccallum, MD 07/03/19 1556

## 2021-05-21 ENCOUNTER — Ambulatory Visit (INDEPENDENT_AMBULATORY_CARE_PROVIDER_SITE_OTHER): Payer: Self-pay

## 2021-05-21 ENCOUNTER — Other Ambulatory Visit: Payer: Self-pay

## 2021-05-21 ENCOUNTER — Ambulatory Visit
Admission: RE | Admit: 2021-05-21 | Discharge: 2021-05-21 | Disposition: A | Discharge status: Home / Self Care | Payer: Medicaid Other | Source: Ambulatory Visit

## 2021-05-21 VITALS — BP 139/81 | HR 74 | Temp 98.8°F | Resp 18 | Ht 64.0 in | Wt 293.0 lb

## 2021-05-21 DIAGNOSIS — M25571 Pain in right ankle and joints of right foot: Secondary | ICD-10-CM

## 2021-05-21 DIAGNOSIS — S93601A Unspecified sprain of right foot, initial encounter: Secondary | ICD-10-CM

## 2021-05-21 DIAGNOSIS — M79671 Pain in right foot: Secondary | ICD-10-CM

## 2021-05-21 NOTE — ED Triage Notes (Signed)
Pt here with C/O right foot pain, pt missed 1 step at home and twisted ankle/foot. States top of right foot hurts with some ankle.  ?

## 2021-05-21 NOTE — Discharge Instructions (Signed)
Your x-rays today did not show the presence of any broken bones but it cannot show a soft tissue. ? ?I suspect that you have sprained your foot and we will provide you a postop shoe to provide better stability and protection of your foot. ? ?Keep your right foot elevated as much as possible to help decrease swelling and help with pain. ? ?You can remove the postop shoe and apply ice or moist heat for 20 minutes at a time 2-3 times a day.  Do not apply either directly to your skin make sure there is a padding between the heat source of her cold sores and your skin to prevent skin damage. ? ?If you do not have any improvement of your pain in 5 to 7 days I recommend following up with podiatry.  I have given you the contact information for Dr. Ardelle Anton with [facility]. ?

## 2021-05-21 NOTE — ED Provider Notes (Signed)
[facility]    CSN: [id number] Arrival date & time: 05/21/21  0945      History   Chief Complaint Chief Complaint  Patient presents with   Foot Pain    right    HPI Alexandria Black is a 31 y.o. female.   HPI  31 year old female here for evaluation of right foot pain.  Patient reports that she missed a step last night and rolled her ankle in an inverted fashion on the landing.  She states that she is having pain on the outside of her foot and on her medial ankle.  She is able to bear weight but she does so with pain.  She denies any numbness, tingling, or weakness in any of her toes.  She has full range of motion.  No distinct swelling or bruising noted.  Past Medical History:  Diagnosis Date   Hypertension    Obesity     There are no problems to display for this patient.   Past Surgical History:  Procedure Laterality Date   ABDOMINAL SURGERY     DILATION AND CURETTAGE OF UTERUS     TONSILLECTOMY      OB History   No obstetric history on file.      Home Medications    Prior to Admission medications   Medication Sig Start Date End Date Taking? Authorizing Provider  FLUoxetine (PROZAC) 40 MG capsule Take 40 mg by mouth every morning. 01/07/21  Yes [provider]  phentermine 37.5 MG capsule Take 37.5 mg by mouth every morning. 02/19/21  Yes [provider]    Family History Family History  Problem Relation Age of Onset   Crohn's disease Mother    COPD Father    Heart failure Father     Social History Social History   Tobacco Use   Smoking status: Never   Smokeless tobacco: Never  Vaping Use   Vaping Use: Never used  Substance Use Topics   Alcohol use: No   Drug use: Not Currently     Allergies   Patient has no active allergies.   Review of Systems Review of Systems  Constitutional:  Negative for fever.  Musculoskeletal:  Positive for arthralgias. Negative for joint swelling.  Skin:  Negative for color  change.  Neurological:  Negative for weakness and numbness.  Hematological: Negative.   Psychiatric/Behavioral: Negative.      Physical Exam Triage Vital Signs ED Triage Vitals  Enc Vitals Group     BP 05/21/21 1001 139/81     Pulse Rate 05/21/21 1001 74     Resp 05/21/21 1001 18     Temp 05/21/21 1001 98.8 F (37.1 C)     Temp Source 05/21/21 1001 Oral     SpO2 05/21/21 1001 97 %     Weight 05/21/21 1000 293 lb (132.9 kg)     Height 05/21/21 1000 5\' 4"  (1.626 m)     Head Circumference --      Peak Flow --      Pain Score 05/21/21 1000 8     Pain Loc --      Pain Edu? --      Excl. in [place]? --    No data found.  Updated Vital Signs BP 139/81 (BP Location: Right Arm)    Pulse 74    Temp 98.8 F (37.1 C) (Oral)    Resp 18    Ht 5\' 4"  (1.626 m)    Wt 293 lb (132.9  kg)    LMP 05/06/2021    SpO2 97%    BMI 50.29 kg/m   Visual Acuity Right Eye Distance:   Left Eye Distance:   Bilateral Distance:    Right Eye Near:   Left Eye Near:    Bilateral Near:     Physical Exam Vitals and nursing note reviewed.  Constitutional:      Appearance: Normal appearance. She is not ill-appearing.  HENT:     Head: Normocephalic and atraumatic.  Musculoskeletal:        General: Tenderness and signs of injury present. No swelling or deformity. Normal range of motion.  Skin:    General: Skin is warm and dry.     Capillary Refill: Capillary refill takes less than 2 seconds.     Findings: No bruising or erythema.  Neurological:     General: No focal deficit present.     Mental Status: She is alert and oriented to person, place, and time.     Sensory: No sensory deficit.     Motor: No weakness.  Psychiatric:        Mood and Affect: Mood normal.        Behavior: Behavior normal.        Thought Content: Thought content normal.        Judgment: Judgment normal.     UC Treatments / Results  Labs (all labs ordered are listed, but only abnormal results are displayed) Labs Reviewed - No  data to display  EKG   Radiology DG Ankle Complete Right  Result Date: 05/21/2021 CLINICAL DATA:  RIGHT foot pain EXAM: RIGHT ANKLE - COMPLETE 3+ VIEW COMPARISON:  None. FINDINGS: No fracture or dislocation of mid foot or forefoot. The phalanges are normal. The calcaneus is normal. No soft tissue abnormality. IMPRESSION: No acute osseous abnormality. Electronically Signed   By: Genevive Bi M.D.   On: 05/21/2021 10:23   DG Foot Complete Right  Result Date: 05/21/2021 CLINICAL DATA:  Right foot pain after twisting injury yesterday. EXAM: RIGHT FOOT COMPLETE - 3+ VIEW COMPARISON:  Right foot x-rays dated November 23, 2010. FINDINGS: There is no evidence of fracture or dislocation. There is no evidence of arthropathy or other focal bone abnormality. Soft tissues are unremarkable. IMPRESSION: Negative. Electronically Signed   By: Obie Dredge M.D.   On: 05/21/2021 10:24    Procedures Procedures (including critical care time)  Medications Ordered in UC Medications - No data to display  Initial Impression / Assessment and Plan / UC Course  I have reviewed the triage vital signs and the nursing notes.  Pertinent labs & imaging results that were available during my care of the patient were reviewed by me and considered in my medical decision making (see chart for details).  Patient is a very pleasant, nontoxic-appearing 31 year old female here for evaluation of right foot and ankle pain after suffering an inversion when she missed a step yesterday.  Her physical exam reveals a right foot ankle that are normal anatomical alignment.  There is no ecchymosis, erythema, or edema noted.  Patient has full sensation in all of her phalanges and full range of motion.  She has full range of motion of her ankle as well but does complain of pain with eversion of her ankle.  Her pain is on the lateral aspect of her foot along the body of the fifth metatarsal.  No pain with palpation over the base of the  fifth metatarsal but she does have pain  when palpating the lateral midfoot.  No crepitus appreciated on exam.  DP and PT pulses are 2+.  No tenderness with palpation of the calcaneus, Achilles, or plantar fascia.  Right foot and ankle films obtained from triage and are pending.  Right foot films independently reviewed and evaluated by me.  Impression: No evidence of fracture or dislocation.  Soft tissues are unremarkable.  Radiology overread is pending. Radiology impression of right foot films shows no fracture or dislocation.  No evidence of arthropathy or focal bone abnormality.  Soft tissue unremarkable.  Negative x-ray.  Right ankle films independently reviewed and evaluated by me.  Impression: There is no evidence of fracture or dislocation.  The mortise joint is well-maintained.  Soft tissues unremarkable.  Radiology overread is pending. Radiology impression of right foot film shows no fracture or dislocation of the midfoot or forefoot.  The phalanges are normal.  The calcaneus is normal.  No soft tissue abnormality.  No acute osseous abnormality.  Suspect patient's pain is all soft tissue in nature and will treat patient for sprain of her right foot.  I will write for a postop shoe and have her keep her right foot elevated is much as possible.  Over-the-counter Tylenol and ibuprofen according back instructions needed for pain.  Ice or moist heat for 20 to 30 minutes at a time 2-3 times a day as needed for pain and inflammation.  If her symptoms do not improve she should follow-up with podiatry.  Contact information provided.   Final Clinical Impressions(s) / UC Diagnoses   Final diagnoses:  Sprain of right foot, initial encounter     Discharge Instructions      Your x-rays today did not show the presence of any broken bones but it cannot show a soft tissue.  I suspect that you have sprained your foot and we will provide you a postop shoe to provide better stability and protection of  your foot.  Keep your right foot elevated as much as possible to help decrease swelling and help with pain.  You can remove the postop shoe and apply ice or moist heat for 20 minutes at a time 2-3 times a day.  Do not apply either directly to your skin make sure there is a padding between the heat source of her cold sores and your skin to prevent skin damage.  If you do not have any improvement of your pain in 5 to 7 days I recommend following up with podiatry.  I have given you the contact information for Dr. Ardelle AntonWagoner with [facility].     ED Prescriptions   None    PDMP not reviewed this encounter.   Becky Augustayan, Eural Holzschuh, NP 05/21/21 1042

## 2021-06-02 ENCOUNTER — Emergency Department: Payer: Medicaid Other

## 2021-06-02 ENCOUNTER — Encounter: Payer: Self-pay | Admitting: Emergency Medicine

## 2021-06-02 ENCOUNTER — Other Ambulatory Visit: Payer: Self-pay

## 2021-06-02 ENCOUNTER — Emergency Department
Admission: EM | Admit: 2021-06-02 | Discharge: 2021-06-02 | Disposition: A | Discharge status: Home / Self Care | Payer: Medicaid Other | Attending: Emergency Medicine | Admitting: Emergency Medicine

## 2021-06-02 DIAGNOSIS — S62174A Nondisplaced fracture of trapezium [larger multangular], right wrist, initial encounter for closed fracture: Secondary | ICD-10-CM | POA: Insufficient documentation

## 2021-06-02 DIAGNOSIS — M25531 Pain in right wrist: Secondary | ICD-10-CM

## 2021-06-02 DIAGNOSIS — Y9301 Activity, walking, marching and hiking: Secondary | ICD-10-CM | POA: Insufficient documentation

## 2021-06-02 DIAGNOSIS — W108XXA Fall (on) (from) other stairs and steps, initial encounter: Secondary | ICD-10-CM | POA: Insufficient documentation

## 2021-06-02 MED ORDER — OXYCODONE-ACETAMINOPHEN 5-325 MG PO TABS
1.0000 | ORAL_TABLET | Freq: Once | ORAL | Status: AC
  Administered 2021-06-02: 1 via ORAL
  Filled 2021-06-02: qty 1

## 2021-06-02 MED ORDER — IBUPROFEN 400 MG PO TABS
400.0000 mg | ORAL_TABLET | Freq: Once | ORAL | Status: AC
  Administered 2021-06-02: 400 mg via ORAL
  Filled 2021-06-02: qty 1

## 2021-06-02 MED ORDER — OXYCODONE-ACETAMINOPHEN 5-325 MG PO TABS: 1.0000 | ORAL_TABLET | Freq: Three times a day (TID) | ORAL | 0 refills | Status: AC | PRN

## 2021-06-02 NOTE — ED Triage Notes (Signed)
Patient ambulatory to triage with steady gait, without difficulty or distress noted; pt reports falling and injuring rt wrist/hand on Monday ?

## 2021-06-02 NOTE — ED Provider Notes (Signed)
? ?[facility] ?Provider Note ? ? ? Event Date/Time  ? First MD Initiated Contact with Patient 06/02/21 0041   ?  (approximate) ? ? ?History  ? ?Wrist Pain ? ? ?HPI ? ?Alexandria Black is a 31 y.o. female without significant past medical history presents for evaluation of right wrist pain.  Patient fell walking down 2 steps 2 days ago falling onto an outstretched right hand and since then has had pain soreness and decreased range of motion in the wrist and right thumb.  She does not have any pain or decreased range of motion in the other fingers and denies any other injury pain or other symptoms in the forearm, elbow, shoulder or anywhere else.  No other recent falls or injuries.  No other acute concerns at this time.  No other recent sick symptoms. ?  ? ? ?Physical Exam  ?Triage Vital Signs: ?ED Triage Vitals  ?Enc Vitals Group  ?   BP 06/02/21 0040 135/86  ?   Pulse Rate 06/02/21 0040 92  ?   Resp 06/02/21 0040 18  ?   Temp 06/02/21 0040 97.9 ?F (36.6 ?C)  ?   Temp Source 06/02/21 0040 Oral  ?   SpO2 06/02/21 0040 98 %  ?   Weight 06/02/21 0039 290 lb (131.5 kg)  ?   Height 06/02/21 0039 5\' 4"  (1.626 m)  ?   Head Circumference --   ?   Peak Flow --   ?   Pain Score --   ?   Pain Loc --   ?   Pain Edu? --   ?   Excl. in [place]? --   ? ? ?Most recent vital signs: ?Vitals:  ? 06/02/21 0040  ?BP: 135/86  ?Pulse: 92  ?Resp: 18  ?Temp: 97.9 ?F (36.6 ?C)  ?SpO2: 98%  ? ? ?General: Awake, no distress.  ?CV:  Good peripheral perfusion.  2+ radial pulses.  All digits are warm and well-perfused with less than 2-second capillary refill in the tips. ?Resp:  Normal effort.  ?Abd:  No distention.  ?Other:  Patient is able to flex and extend all digits including the thumb against resistance as well as extend although she is significantly weaker in the right thumb.  There is some right-sided snuffbox tenderness as well as over the base of the right thumb.  No palmar tenderness and she is able to flex and extend the  wrist.  Sensation is intact in the distribution of the radial, ulnar and median nerves. ? ? ?ED Results / Procedures / Treatments  ?Labs ?(all labs ordered are listed, but only abnormal results are displayed) ?Labs Reviewed  ?POC URINE PREG, ED  ? ? ? ?EKG ? ? ? ?RADIOLOGY ? ?Pain, right wrist, interpretation shows nondisplaced fracture of the right trapezium without other clear fracture or dislocation.  Also reviewed radiology interpretation and agree with the findings of same.  No other acute process. ? ? ?PROCEDURES: ? ?Critical Care performed: No ? ?Procedures ? ? ? ?MEDICATIONS ORDERED IN ED: ?Medications  ?oxyCODONE-acetaminophen (PERCOCET/ROXICET) 5-325 MG per tablet 1 tablet (has no administration in time range)  ?ibuprofen (ADVIL) tablet 400 mg (has no administration in time range)  ? ? ? ?IMPRESSION / MDM / ASSESSMENT AND PLAN / ED COURSE  ?I reviewed the triage vital signs and the nursing notes. ?             ?               ? ?  Differential diagnosis includes, but is not limited to fracture, contusion with low suspicion at this time for compartment syndrome or significant neurovascular injury. ? ?Pain, right wrist, interpretation shows nondisplaced fracture of the right trapezium without other clear fracture or dislocation.  Also reviewed radiology interpretation and agree with the findings of same.  No other acute process. ? ?There is also a possible there is an occult scaphoid injury given she does have some snuffbox tenderness.  Will place in thumb spica splint and have patient follow-up with orthopedic service.  Discussed returning for any new pain weakness numbness or tingling.  Will write Rx for analgesia.  Discharged in stable condition. ?  ? ? ?FINAL CLINICAL IMPRESSION(S) / ED DIAGNOSES  ? ?Final diagnoses:  ?Right wrist pain  ?Closed nondisplaced fracture of trapezium of right wrist, initial encounter  ? ? ? ?Rx / DC Orders  ? ?ED Discharge Orders   ? ? None  ? ?  ? ? ? ?Note:  This document was  prepared using Dragon voice recognition software and may include unintentional dictation errors. ?  ?Lucrezia Starch, MD ?06/02/21 0113 ? ?

## 2021-12-06 ENCOUNTER — Other Ambulatory Visit: Payer: Self-pay

## 2021-12-06 ENCOUNTER — Emergency Department
Admission: EM | Admit: 2021-12-06 | Discharge: 2021-12-06 | Disposition: A | Discharge status: Home / Self Care | Payer: Self-pay | Attending: Emergency Medicine | Admitting: Emergency Medicine

## 2021-12-06 ENCOUNTER — Emergency Department: Payer: Self-pay

## 2021-12-06 ENCOUNTER — Encounter: Payer: Self-pay | Admitting: Emergency Medicine

## 2021-12-06 DIAGNOSIS — S5011XA Contusion of right forearm, initial encounter: Secondary | ICD-10-CM | POA: Insufficient documentation

## 2021-12-06 DIAGNOSIS — W228XXA Striking against or struck by other objects, initial encounter: Secondary | ICD-10-CM | POA: Insufficient documentation

## 2021-12-06 DIAGNOSIS — I10 Essential (primary) hypertension: Secondary | ICD-10-CM | POA: Insufficient documentation

## 2021-12-06 MED ORDER — HYDROCODONE-ACETAMINOPHEN 5-325 MG PO TABS
1.0000 | ORAL_TABLET | Freq: Once | ORAL | Status: AC
  Administered 2021-12-06: 1 via ORAL
  Filled 2021-12-06: qty 1

## 2021-12-06 MED ORDER — HYDROCODONE-ACETAMINOPHEN 5-325 MG PO TABS: 1.0000 | ORAL_TABLET | Freq: Four times a day (QID) | ORAL | 0 refills | Status: DC | PRN

## 2021-12-06 MED ORDER — IBUPROFEN 800 MG PO TABS: 800.0000 mg | ORAL_TABLET | Freq: Three times a day (TID) | ORAL | 0 refills | Status: DC | PRN

## 2021-12-06 MED ORDER — IBUPROFEN 800 MG PO TABS
800.0000 mg | ORAL_TABLET | Freq: Once | ORAL | Status: AC
  Administered 2021-12-06: 800 mg via ORAL
  Filled 2021-12-06: qty 1

## 2021-12-06 NOTE — ED Triage Notes (Signed)
Pt arrived via POV with reports of arm injury, pt was upset with daughter, states she started banging arms on car window, window did not break, pt c/o pain from wrist down to elbow no the R side, pt also states she hit her L arm,but states the pain is worse on the R

## 2021-12-06 NOTE — Discharge Instructions (Signed)
You may take pain medicines as needed (Motrin/Norco). Wear sling as needed for comfort. Return to the ER for worsening symptoms, persistent vomiting or other concerns.
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
## 2022-03-08 ENCOUNTER — Other Ambulatory Visit: Payer: Self-pay

## 2022-03-08 DIAGNOSIS — F1290 Cannabis use, unspecified, uncomplicated: Secondary | ICD-10-CM | POA: Insufficient documentation

## 2022-03-08 DIAGNOSIS — I10 Essential (primary) hypertension: Secondary | ICD-10-CM | POA: Insufficient documentation

## 2022-03-08 DIAGNOSIS — R112 Nausea with vomiting, unspecified: Secondary | ICD-10-CM | POA: Insufficient documentation

## 2022-03-08 DIAGNOSIS — E876 Hypokalemia: Secondary | ICD-10-CM | POA: Insufficient documentation

## 2022-03-08 LAB — COMPREHENSIVE METABOLIC PANEL
ALT: 16 U/L (ref 0–44)
AST: 19 U/L (ref 15–41)
Albumin: 3.8 g/dL (ref 3.5–5.0)
Alkaline Phosphatase: 56 U/L (ref 38–126)
Anion gap: 8 (ref 5–15)
BUN: 14 mg/dL (ref 6–20)
CO2: 23 mmol/L (ref 22–32)
Calcium: 9.4 mg/dL (ref 8.9–10.3)
Chloride: 108 mmol/L (ref 98–111)
Creatinine, Ser: 0.62 mg/dL (ref 0.44–1.00)
GFR, Estimated: >60 mL/min (ref >60)
Glucose, Bld: 139 mg/dL — ABNORMAL HIGH (ref 70–99)
Potassium: 3.2 mmol/L — ABNORMAL LOW (ref 3.5–5.1)
Sodium: 139 mmol/L (ref 135–145)
Total Bilirubin: 0.4 mg/dL (ref 0.3–1.2)
Total Protein: 7.5 g/dL (ref 6.5–8.1)

## 2022-03-08 LAB — CBC
HCT: 39.4 % (ref 36.0–46.0)
Hemoglobin: 12.8 g/dL (ref 12.0–15.0)
MCH: 26.6 pg (ref 26.0–34.0)
MCHC: 32.5 g/dL (ref 30.0–36.0)
MCV: 81.9 fL (ref 80.0–100.0)
Platelets: 431 10*3/uL — ABNORMAL HIGH (ref 150–400)
RBC: 4.81 MIL/uL (ref 3.87–5.11)
RDW: 13.3 % (ref 11.5–15.5)
WBC: 14.8 10*3/uL — ABNORMAL HIGH (ref 4.0–10.5)
nRBC: 0 % (ref 0.0–0.2)

## 2022-03-08 LAB — LIPASE, BLOOD: Lipase: 44 U/L (ref 11–51)

## 2022-03-08 NOTE — ED Triage Notes (Signed)
Pt comes from home via [facility] c/o emesis. At around 4pm, Pt took 3 10mg  THC hemp gummies, label says to take 1 gummy Pt drowsy in triage. Pt has vomited 6x after taking gummies. NAD at this time. Denies CP, SOB

## 2022-03-09 ENCOUNTER — Emergency Department
Admission: EM | Admit: 2022-03-09 | Discharge: 2022-03-09 | Disposition: A | Discharge status: Home / Self Care | Payer: Self-pay | Attending: Emergency Medicine | Admitting: Emergency Medicine

## 2022-03-09 ENCOUNTER — Emergency Department: Payer: Self-pay

## 2022-03-09 DIAGNOSIS — F1290 Cannabis use, unspecified, uncomplicated: Secondary | ICD-10-CM

## 2022-03-09 DIAGNOSIS — R112 Nausea with vomiting, unspecified: Secondary | ICD-10-CM

## 2022-03-09 DIAGNOSIS — E876 Hypokalemia: Secondary | ICD-10-CM

## 2022-03-09 LAB — URINALYSIS, ROUTINE W REFLEX MICROSCOPIC
Bilirubin Urine: NEGATIVE
Glucose, UA: NEGATIVE mg/dL
Hgb urine dipstick: NEGATIVE
Ketones, ur: NEGATIVE mg/dL
Nitrite: NEGATIVE
Protein, ur: 30 mg/dL — AB
Specific Gravity, Urine: 1.025 (ref 1.005–1.030)
pH: 5 (ref 5.0–8.0)

## 2022-03-09 LAB — POC URINE PREG, ED: Preg Test, Ur: NEGATIVE

## 2022-03-09 MED ORDER — SODIUM CHLORIDE 0.9 % IV BOLUS
1000.0000 mL | Freq: Once | INTRAVENOUS | Status: AC
  Administered 2022-03-09: 1000 mL via INTRAVENOUS

## 2022-03-09 MED ORDER — POTASSIUM CHLORIDE CRYS ER 20 MEQ PO TBCR
40.0000 meq | EXTENDED_RELEASE_TABLET | Freq: Once | ORAL | Status: AC
Start: 2022-03-09 — End: 2022-03-09
  Administered 2022-03-09: 40 meq via ORAL

## 2022-03-09 NOTE — Discharge Instructions (Signed)
Clear liquids x 12 hours, then bland diet x 3 days, then slowly advance diet as tolerated.  Avoid eating edibles.  Return to the ER for worsening symptoms, persistent vomiting, difficulty breathing or other concerns.

## 2022-03-09 NOTE — ED Provider Notes (Signed)
[facility] Provider Note    Event Date/Time   First MD Initiated Contact with Patient 03/09/22 [phone]     (approximate)   History   Emesis and Ingestion   HPI  Alexandria Black is a 31 y.o. female brought to the ED via EMS from home with a chief complaint of emesis and somnolence.  Reportedly patient took 3, 10 mg THC Gummies around 4 PM.  Vomited 6 times.  Currently no complaints of nausea or vomiting.  Denies chest pain, shortness of breath or diarrhea.     Past Medical History   Past Medical History:  Diagnosis Date   Hypertension    Obesity      Active Problem List  There are no problems to display for this patient.    Past Surgical History   Past Surgical History:  Procedure Laterality Date   ABDOMINAL SURGERY     DILATION AND CURETTAGE OF UTERUS     TONSILLECTOMY       Home Medications   Prior to Admission medications   Medication Sig Start Date End Date Taking? Authorizing Provider  FLUoxetine (PROZAC) 40 MG capsule Take 40 mg by mouth every morning. 01/07/21   [provider]  HYDROcodone-acetaminophen (NORCO) 5-325 MG tablet Take 1 tablet by mouth every 6 (six) hours as needed for moderate pain. 12/06/21   Irean Hong, MD  ibuprofen (ADVIL) 800 MG tablet Take 1 tablet (800 mg total) by mouth every 8 (eight) hours as needed for moderate pain. 12/06/21   Irean Hong, MD  phentermine 37.5 MG capsule Take 37.5 mg by mouth every morning. 02/19/21   [provider]     Allergies  Patient has no known allergies.   Family History   Family History  Problem Relation Age of Onset   Crohn's disease Mother    COPD Father    Heart failure Father      Physical Exam  Triage Vital Signs: ED Triage Vitals  Enc Vitals Group     BP 03/08/22 2139 122/77     Pulse Rate 03/08/22 2139 93     Resp 03/08/22 2139 (!) 22     Temp 03/08/22 2139 97.7 F (36.5 C)     Temp Source 03/08/22 2139 Oral     SpO2 03/08/22 2139  98 %     Weight 03/08/22 2140 293 lb (132.9 kg)     Height 03/08/22 2140 5\' 4"  (1.626 m)     Head Circumference --      Peak Flow --      Pain Score 03/08/22 2140 0     Pain Loc --      Pain Edu? --      Excl. in GC? --     Updated Vital Signs: BP 114/78 (BP Location: Left Arm)   Pulse 94   Temp 97.9 F (36.6 C) (Oral)   Resp 18   Ht 5\' 4"  (1.626 m)   Wt 132.9 kg   SpO2 97%   BMI 50.29 kg/m    General: Awake, no distress.  CV:  RRR.  Good peripheral perfusion.  Resp:  Normal effort.  CTAB. Abd:             Obese, nontender.  No distention.  Other:  Sleepy but arousable to voice.   ED Results / Procedures / Treatments  Labs (all labs ordered are listed, but only abnormal results are displayed) Labs Reviewed  COMPREHENSIVE METABOLIC PANEL -  Abnormal; Notable for the following components:      Result Value   Potassium 3.2 (*)    Glucose, Bld 139 (*)    All other components within normal limits  CBC - Abnormal; Notable for the following components:   WBC 14.8 (*)    Platelets 431 (*)    All other components within normal limits  URINALYSIS, ROUTINE W REFLEX MICROSCOPIC - Abnormal; Notable for the following components:   Color, Urine YELLOW (*)    APPearance HAZY (*)    Protein, ur 30 (*)    Leukocytes,Ua SMALL (*)    Bacteria, UA RARE (*)    All other components within normal limits  LIPASE, BLOOD  URINE DRUG SCREEN, QUALITATIVE ([facility] ONLY)  POC URINE PREG, ED     EKG  ED ECG REPORT I, Imraan Wendell J, the attending physician, personally viewed and interpreted this ECG.   Date: 03/09/2022  EKG Time: 2155  Rate: 94  Rhythm: normal sinus rhythm  Axis: Normal  Intervals:none  ST&T Change: Nonspecific    RADIOLOGY I have independently visualized and interpreted patient's CT as well as noted the radiology interpretation:  CT head: No ICH   Official radiology report(s): CT Head Wo Contrast  Result Date: 03/09/2022 CLINICAL DATA:  31 year old female  with altered mental status and vomiting. EXAM: CT HEAD WITHOUT CONTRAST TECHNIQUE: Contiguous axial images were obtained from the base of the skull through the vertex without intravenous contrast. RADIATION DOSE REDUCTION: This exam was performed according to the departmental dose-optimization program which includes automated exposure control, adjustment of the mA and/or kV according to patient size and/or use of iterative reconstruction technique. COMPARISON:  Brain MRI 01/13/2005.  Head CT 10/30/2007. FINDINGS: Brain: Normal cerebral volume. No midline shift, ventriculomegaly, mass effect, evidence of mass lesion, intracranial hemorrhage or evidence of cortically based acute infarction. Gray-white matter differentiation is within normal limits throughout the brain. Partially empty sella is new since 2006, but likely was present in 2009. Vascular: No suspicious intracranial vascular hyperdensity. Skull: No acute osseous abnormality identified. Sinuses/Orbits: Bilateral maxillary sinus mucosal thickening and bubbly opacity. Other visualized paranasal sinuses and mastoids are well aerated. Other: Visualized orbits and scalp soft tissues are within normal limits. IMPRESSION: 1. 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 2. Bilateral maxillary sinus inflammation. Consider acute sinusitis in the appropriate clinical setting. Electronically Signed   By: Odessa Fleming M.D.   On: 03/09/2022 05:40     PROCEDURES:  Critical Care performed: No  Procedures   MEDICATIONS ORDERED IN ED: Medications  sodium chloride 0.9 % bolus 1,000 mL (0 mLs Intravenous Stopped 03/09/22 0512)  potassium chloride SA (KLOR-CON M) CR tablet 40 mEq (40 mEq Oral Given 03/09/22 0408)     IMPRESSION / MDM / ASSESSMENT AND PLAN / ED COURSE  I reviewed the triage vital signs and the nursing notes.                             31 year old female presenting with  nausea/vomiting and somnolence after eating edibles.  Differential diagnosis includes but is not limited to cannabinoid hyperemesis, ICH, ACS, metabolic, toxicological etiologies, etc. I have personally reviewed patient's records and note a PCP office visit for lumbosacral strain on 02/21/2022.  Patient's presentation is most consistent with acute presentation with potential threat to life or bodily function.  Laboratory results demonstrate moderate leukocytosis with WBC 14.8, mild  hypokalemia potassium 3.2.  Patient just urinated and sample was pending.  Somnolence likely secondary to edibles but will obtain CT head to rule out intracranial hemorrhage.  Administer IV fluids, replete potassium orally and reassess.  Clinical Course as of 03/09/22 4496  Wed Mar 09, 2022  7591 CT head is unremarkable.  Patient awake, alert without complaints.  She is safe for discharge home with her family member.  Strict return precautions given.  Both verbalized understanding and agree with plan of care. [JS]    Clinical Course User Index [JS] Irean Hong, MD     FINAL CLINICAL IMPRESSION(S) / ED DIAGNOSES   Final diagnoses:  Nausea and vomiting, unspecified vomiting type  Use of cannabinoid edibles  Hypokalemia     Rx / DC Orders   ED Discharge Orders     None        Note:  This document was prepared using Dragon voice recognition software and may include unintentional dictation errors.   Irean Hong, MD 03/09/22 [phone]

## 2023-01-22 ENCOUNTER — Encounter: Payer: Self-pay | Admitting: Emergency Medicine

## 2023-01-22 ENCOUNTER — Ambulatory Visit: Payer: Medicaid Other

## 2023-01-22 ENCOUNTER — Ambulatory Visit
Admission: EM | Admit: 2023-01-22 | Discharge: 2023-01-22 | Disposition: A | Discharge status: Home / Self Care | Payer: Medicaid Other | Attending: Physician Assistant | Admitting: Physician Assistant

## 2023-01-22 DIAGNOSIS — S6992XA Unspecified injury of left wrist, hand and finger(s), initial encounter: Secondary | ICD-10-CM

## 2023-01-22 DIAGNOSIS — M79642 Pain in left hand: Secondary | ICD-10-CM

## 2023-01-22 NOTE — ED Triage Notes (Signed)
Patient states that she fell on Friday and tried to catch herself with her left hand.  Patient c/o pain and swelling and bruising in the palm of her left hand.

## 2023-01-22 NOTE — Discharge Instructions (Signed)
-  No fractures noted HAND PAIN: Stressed avoiding painful activities . Reviewed RICE guidelines. Use medications as directed, including NSAIDs. If no NSAIDs have been prescribed for you today, you may take Aleve or Motrin over the counter. May use Tylenol in between doses of NSAIDs.  If no improvement in the next 1-2 weeks, f/u with PCP or return to our office for reexamination, and please feel free to call or return at any time for any questions or concerns you may have and we will be happy to help you!

## 2023-01-22 NOTE — ED Provider Notes (Signed)
[facility]    CSN: [id number] Arrival date & time: 01/22/23  0903      History   Chief Complaint Chief Complaint  Patient presents with   Fall   Hand Pain    HPI Alexandria Black is a 32 y.o. female presenting for pain of left hand for the past 2 days. States she fell and put her hands out to brace herself.  Reports the pain was not that bad until she tried to lift some weights the following day when pain worsened.  She is reporting most pain of the second and third metacarpals and proximal phalanx of those fingers.  Denies numbness, tingling or weakness.  Has been taking ibuprofen.  No history of fracture of this hand.  No other injuries reported.  HPI  Past Medical History:  Diagnosis Date   Hypertension    Obesity     There are no problems to display for this patient.   Past Surgical History:  Procedure Laterality Date   ABDOMINAL SURGERY     DILATION AND CURETTAGE OF UTERUS     TONSILLECTOMY      OB History   No obstetric history on file.      Home Medications    Prior to Admission medications   Medication Sig Start Date End Date Taking? Authorizing Provider  FLUoxetine (PROZAC) 40 MG capsule Take 40 mg by mouth every morning. 01/07/21  Yes [provider]  HYDROcodone-acetaminophen (NORCO) 5-325 MG tablet Take 1 tablet by mouth every 6 (six) hours as needed for moderate pain. 12/06/21   Irean Hong, MD  ibuprofen (ADVIL) 800 MG tablet Take 1 tablet (800 mg total) by mouth every 8 (eight) hours as needed for moderate pain. 12/06/21   Irean Hong, MD  phentermine 37.5 MG capsule Take 37.5 mg by mouth every morning. 02/19/21   [provider]    Family History Family History  Problem Relation Age of Onset   Crohn's disease Mother    COPD Father    Heart failure Father     Social History Social History   Tobacco Use   Smoking status: Never   Smokeless tobacco: Never  Vaping Use   Vaping status: Never Used  Substance  Use Topics   Alcohol use: No   Drug use: Not Currently     Allergies   Patient has no known allergies.   Review of Systems Review of Systems  Musculoskeletal:  Positive for arthralgias and joint swelling.  Skin:  Negative for color change and wound.  Neurological:  Negative for weakness and numbness.     Physical Exam Triage Vital Signs ED Triage Vitals  Encounter Vitals Group     BP 01/22/23 0947 (!) 141/82     Systolic BP Percentile --      Diastolic BP Percentile --      Pulse Rate 01/22/23 0947 68     Resp 01/22/23 0947 15     Temp 01/22/23 0947 98.6 F (37 C)     Temp Source 01/22/23 0947 Oral     SpO2 01/22/23 0947 100 %     Weight 01/22/23 0946 292 lb 15.9 oz (132.9 kg)     Height 01/22/23 0946 5\' 4"  (1.626 m)     Head Circumference --      Peak Flow --      Pain Score 01/22/23 0945 5     Pain Loc --      Pain Education --  Exclude from Growth Chart --    No data found.  Updated Vital Signs BP (!) 141/82 (BP Location: Right Arm)   Pulse 68   Temp 98.6 F (37 C) (Oral)   Resp 15   Ht 5\' 4"  (1.626 m)   Wt 292 lb 15.9 oz (132.9 kg)   LMP 01/16/2023   SpO2 100%   BMI 50.29 kg/m     Physical Exam Vitals and nursing note reviewed.  Constitutional:      General: She is not in acute distress.    Appearance: Normal appearance. She is not ill-appearing or toxic-appearing.  HENT:     Head: Normocephalic and atraumatic.  Eyes:     General: No scleral icterus.       Right eye: No discharge.        Left eye: No discharge.     Conjunctiva/sclera: Conjunctivae normal.  Cardiovascular:     Rate and Rhythm: Normal rate and regular rhythm.     Pulses: Normal pulses.  Pulmonary:     Effort: Pulmonary effort is normal. No respiratory distress.  Musculoskeletal:     Cervical back: Neck supple.     Comments: Left hand: There is mild swelling of the second and third metacarpals and tenderness of these bones.  Also TTP of proximal phalanx of the second  and third digits.  Good pulses, strength and sensation.  Full range of motion.  Increased pain with making a fist.  Skin:    General: Skin is dry.  Neurological:     General: No focal deficit present.     Mental Status: She is alert. Mental status is at baseline.     Motor: No weakness.     Gait: Gait normal.  Psychiatric:        Mood and Affect: Mood normal.      UC Treatments / Results  Labs (all labs ordered are listed, but only abnormal results are displayed) Labs Reviewed - No data to display  EKG   Radiology DG Hand Complete Left  Result Date: 01/22/2023 CLINICAL DATA:  Left hand pain and swelling.  Status post fall. EXAM: LEFT HAND - COMPLETE 3+ VIEW COMPARISON:  None Available. FINDINGS: There is no evidence of fracture or dislocation. There is no evidence of arthropathy or other focal bone abnormality. Soft tissues are unremarkable. IMPRESSION: Negative. Electronically Signed   By: Signa Kell M.D.   On: 01/22/2023 10:33    Procedures Procedures (including critical care time)  Medications Ordered in UC Medications - No data to display  Initial Impression / Assessment and Plan / UC Course  I have reviewed the triage vital signs and the nursing notes.  Pertinent labs & imaging results that were available during my care of the patient were reviewed by me and considered in my medical decision making (see chart for details).   32 year old female presents for left hand pain after FOOSH injury 2 days ago.  Pain worsened after trying to lift weights yesterday.  X-ray of left hand reveals no fractures.  Patient given Ace wrap.  Reviewed RICE guidelines.  Declines prescription medicine.  Advised ibuprofen, Tylenol, avoiding painful activities.  Reviewed return precautions.   Final Clinical Impressions(s) / UC Diagnoses   Final diagnoses:  Left hand pain  Injury of left hand, initial encounter     Discharge Instructions      -No fractures noted HAND PAIN:  Stressed avoiding painful activities . Reviewed RICE guidelines. Use medications as directed, including NSAIDs. If  no NSAIDs have been prescribed for you today, you may take Aleve or Motrin over the counter. May use Tylenol in between doses of NSAIDs.  If no improvement in the next 1-2 weeks, f/u with PCP or return to our office for reexamination, and please feel free to call or return at any time for any questions or concerns you may have and we will be happy to help you!         ED Prescriptions   None    PDMP not reviewed this encounter.   Alexandria Latch, PA-C 01/22/23 1043

## 2023-06-04 ENCOUNTER — Encounter: Payer: Self-pay | Admitting: Emergency Medicine

## 2023-06-04 ENCOUNTER — Ambulatory Visit (INDEPENDENT_AMBULATORY_CARE_PROVIDER_SITE_OTHER): Payer: Self-pay

## 2023-06-04 ENCOUNTER — Ambulatory Visit
Admission: EM | Admit: 2023-06-04 | Discharge: 2023-06-04 | Disposition: A | Discharge status: Home / Self Care | Payer: Self-pay

## 2023-06-04 DIAGNOSIS — R0602 Shortness of breath: Secondary | ICD-10-CM

## 2023-06-04 DIAGNOSIS — R051 Acute cough: Secondary | ICD-10-CM

## 2023-06-04 DIAGNOSIS — J0100 Acute maxillary sinusitis, unspecified: Secondary | ICD-10-CM

## 2023-06-04 DIAGNOSIS — U071 COVID-19: Secondary | ICD-10-CM

## 2023-06-04 MED ORDER — DEXAMETHASONE 4 MG PO TABS: 4.0000 mg | ORAL_TABLET | Freq: Every day | ORAL | 0 refills | Status: DC

## 2023-06-04 MED ORDER — AMOXICILLIN-POT CLAVULANATE 875-125 MG PO TABS: 1.0000 | ORAL_TABLET | Freq: Two times a day (BID) | ORAL | 0 refills | Status: AC

## 2023-06-04 NOTE — ED Provider Notes (Signed)
 [facility]    CSN: [id number] Arrival date & time: 06/04/23  7829      History   Chief Complaint Chief Complaint  Patient presents with   Nasal Congestion   Cough    HPI Alexandria Black is a 33 y.o. female.   Pleasant 32 year old female presents today due to concerns of being COVID-positive.  She started having symptoms on Monday of last week, tested positive on Tuesday and then retested again on Friday.  Both test were positive.  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  She has had COVID 4 times in the past, only the first time being complicated by pneumonia.  She states her current symptoms feel similar to that first time.  She reports some shortness of breath, cough used to be productive, but now she is having trouble bringing it up.  She has been trying DayQuil, NyQuil, and Mucinex with mixed results.  She does have a mild headache and nasal congestion.  She feels like she is developing a sinus infection as well.   Cough   Past Medical History:  Diagnosis Date   Hypertension    Obesity     There are no active problems to display for this patient.   Past Surgical History:  Procedure Laterality Date   ABDOMINAL SURGERY     DILATION AND CURETTAGE OF UTERUS     TONSILLECTOMY      OB History   No obstetric history on file.      Home Medications    Prior to Admission medications   Medication Sig Start Date End Date Taking? Authorizing Provider  amoxicillin-clavulanate (AUGMENTIN) 875-125 MG tablet Take 1 tablet by mouth 2 (two) times daily with a meal. 06/04/23  Yes Francia Verry L, PA  dexamethasone (DECADRON) 4 MG tablet Take 1 tablet (4 mg total) by mouth daily. 06/04/23  Yes Emilene Roma L, PA  FLUoxetine (PROZAC) 40 MG capsule Take 40 mg by mouth every morning. 01/07/21   [provider]  hydrochlorothiazide (HYDRODIURIL) 25 MG tablet Take 25 mg by mouth daily.    [provider]  ibuprofen (ADVIL)  800 MG tablet Take 1 tablet (800 mg total) by mouth every 8 (eight) hours as needed for moderate pain. 12/06/21   Irean Hong, MD    Family History Family History  Problem Relation Age of Onset   Crohn's disease Mother    COPD Father    Heart failure Father     Social History Social History   Tobacco Use   Smoking status: Never   Smokeless tobacco: Never  Vaping Use   Vaping status: Never Used  Substance Use Topics   Alcohol use: No   Drug use: Not Currently     Allergies   Patient has no known allergies.   Review of Systems Review of Systems  Respiratory:  Positive for cough.   As per HPI   Physical Exam Triage Vital Signs ED Triage Vitals  Encounter Vitals Group     BP 06/04/23 0911 (!) 140/97     Systolic BP Percentile --      Diastolic BP Percentile --      Pulse Rate 06/04/23 0911 72     Resp 06/04/23 0911 15     Temp 06/04/23 0911 98.2 F (36.8 C)     Temp Source 06/04/23 0911 Oral     SpO2 06/04/23 0911 100 %     Weight 06/04/23 0910  292 lb 15.9 oz (132.9 kg)     Height 06/04/23 0910 5\' 4"  (1.626 m)     Head Circumference --      Peak Flow --      Pain Score 06/04/23 0909 6     Pain Loc --      Pain Education --      Exclude from Growth Chart --    No data found.  Updated Vital Signs BP (!) 140/97 (BP Location: Left Arm)   Pulse 72   Temp 98.2 F (36.8 C) (Oral)   Resp 15   Ht 5\' 4"  (1.626 m)   Wt 292 lb 15.9 oz (132.9 kg)   LMP 05/14/2023 (Approximate)   SpO2 100%   BMI 50.29 kg/m   Visual Acuity Right Eye Distance:   Left Eye Distance:   Bilateral Distance:    Right Eye Near:   Left Eye Near:    Bilateral Near:     Physical Exam Vitals and nursing note reviewed.  Constitutional:      General: She is not in acute distress.    Appearance: Normal appearance. She is obese. She is ill-appearing. She is not toxic-appearing or diaphoretic.  HENT:     Head: Normocephalic and atraumatic.     Right Ear: Tympanic membrane, ear  canal and external ear normal. There is no impacted cerumen.     Left Ear: Tympanic membrane, ear canal and external ear normal. There is no impacted cerumen.     Nose: Congestion and rhinorrhea present.     Right Turbinates: Enlarged and swollen.     Left Turbinates: Enlarged and swollen.     Right Sinus: Maxillary sinus tenderness present. No frontal sinus tenderness.     Left Sinus: Maxillary sinus tenderness present. No frontal sinus tenderness.     Mouth/Throat:     Mouth: Mucous membranes are moist.     Pharynx: Oropharynx is clear. No oropharyngeal exudate or posterior oropharyngeal erythema.  Eyes:     General: No scleral icterus.       Right eye: No discharge.        Left eye: No discharge.     Extraocular Movements: Extraocular movements intact.     Pupils: Pupils are equal, round, and reactive to light.  Cardiovascular:     Rate and Rhythm: Normal rate and regular rhythm.  Pulmonary:     Effort: Pulmonary effort is normal. No respiratory distress.     Breath sounds: Rhonchi (with decreased breath sounds posterior to RLL only) present. No wheezing.  Musculoskeletal:     Cervical back: Normal range of motion and neck supple. No rigidity or tenderness.  Lymphadenopathy:     Cervical: Cervical adenopathy present.  Skin:    General: Skin is warm.     Findings: No erythema.     Comments: Pt feels moist to the touch  Neurological:     General: No focal deficit present.     Mental Status: She is alert and oriented to person, place, and time.  Psychiatric:        Mood and Affect: Mood normal.        Behavior: Behavior normal.      UC Treatments / Results  Labs (all labs ordered are listed, but only abnormal results are displayed) Labs Reviewed - No data to display  EKG   Radiology DG Chest 2 View Result Date: 06/04/2023 CLINICAL DATA:  Cough, shortness of breath, COVID positive, possible right lower lobe pneumonia. EXAM:  CHEST - 2 VIEW COMPARISON:  04/24/2019.  FINDINGS: Trachea is midline. Heart size normal. Lungs are clear. No pleural fluid. IMPRESSION: Negative. Electronically Signed   By: Leanna Battles M.D.   On: 06/04/2023 09:50    Procedures Procedures (including critical care time)  Medications Ordered in UC Medications - No data to display  Initial Impression / Assessment and Plan / UC Course  I have reviewed the triage vital signs and the nursing notes.  Pertinent labs & imaging results that were available during my care of the patient were reviewed by me and considered in my medical decision making (see chart for details).     Covid -patient is past the treatment window to treat with antivirals, thus supportive measures are appropriate at this time.  Vital signs appear stable other than a minimally elevated blood pressure likely due to her cough. Cough with shortness of breath -given history of pneumonia with COVID in the past a chest x-ray was obtained which reveals no acute abnormalities.  Suspect this is secondary to #1 Maxillary sinusitis -patient does appear uncomfortable, given concurrent symptoms will treat with Decadron and Augmentin.   Final Clinical Impressions(s) / UC Diagnoses   Final diagnoses:  COVID  Shortness of breath  Acute cough  Acute non-recurrent maxillary sinusitis     Discharge Instructions      Your chest xray does not reveal any pneumonia. Given the timeframe of symptom onset, unfortunately antivirals for covid will not be effective.  I do want to cover you for a developing sinus infection. Please take Augmentin twice daily with food x 10 days. Please also take one tablet of dexamethasone daily with breakfast for the next 5 days.  Stay HYDRATED with water!! Take OTC Mucinex 600mg  twice daily (do not take decongestants)  Out of work x 3 days.     ED Prescriptions     Medication Sig Dispense Auth. Provider   amoxicillin-clavulanate (AUGMENTIN) 875-125 MG tablet Take 1 tablet by mouth 2  (two) times daily with a meal. 20 tablet Jennifer Holland L, PA   dexamethasone (DECADRON) 4 MG tablet Take 1 tablet (4 mg total) by mouth daily. 5 tablet Laria Grimmett L, [place]      PDMP not reviewed this encounter.   Maretta Bees, [place] 06/04/23 1058

## 2023-06-04 NOTE — ED Triage Notes (Signed)
 Patient c/o cough, chest congestion and nasal congestion that started on Monday.  Patient took home covid test on Tuesday and Friday and both were positive.  Patient denies fevers.

## 2023-06-04 NOTE — Discharge Instructions (Signed)
 Your chest xray does not reveal any pneumonia. Given the timeframe of symptom onset, unfortunately antivirals for covid will not be effective.  I do want to cover you for a developing sinus infection. Please take Augmentin twice daily with food x 10 days. Please also take one tablet of dexamethasone daily with breakfast for the next 5 days.  Stay HYDRATED with water!! Take OTC Mucinex 600mg  twice daily (do not take decongestants)  Out of work x 3 days.

## 2023-06-11 ENCOUNTER — Ambulatory Visit (INDEPENDENT_AMBULATORY_CARE_PROVIDER_SITE_OTHER): Payer: Self-pay

## 2023-06-11 ENCOUNTER — Ambulatory Visit
Admission: EM | Admit: 2023-06-11 | Discharge: 2023-06-11 | Disposition: A | Discharge status: Home / Self Care | Payer: Self-pay | Attending: Emergency Medicine | Admitting: Emergency Medicine

## 2023-06-11 ENCOUNTER — Encounter: Payer: Self-pay | Admitting: Emergency Medicine

## 2023-06-11 DIAGNOSIS — J9801 Acute bronchospasm: Secondary | ICD-10-CM

## 2023-06-11 DIAGNOSIS — U071 COVID-19: Secondary | ICD-10-CM

## 2023-06-11 MED ORDER — IPRATROPIUM-ALBUTEROL 0.5-2.5 (3) MG/3ML IN SOLN
3.0000 mL | Freq: Once | RESPIRATORY_TRACT | Status: AC
  Administered 2023-06-11: 3 mL via RESPIRATORY_TRACT

## 2023-06-11 MED ORDER — PREDNISONE 10 MG (21) PO TBPK: ORAL_TABLET | ORAL | 0 refills | Status: AC

## 2023-06-11 MED ORDER — ALBUTEROL SULFATE HFA 108 (90 BASE) MCG/ACT IN AERS: 2.0000 | INHALATION_SPRAY | RESPIRATORY_TRACT | 0 refills | Status: AC | PRN

## 2023-06-11 MED ORDER — IBUPROFEN 600 MG PO TABS: 600.0000 mg | ORAL_TABLET | Freq: Three times a day (TID) | ORAL | 0 refills | Status: AC | PRN

## 2023-06-11 MED ORDER — AEROCHAMBER MV MISC: 1 refills | Status: AC

## 2023-06-11 NOTE — ED Triage Notes (Addendum)
 Patient states that she recently had COVID.  Patient reports SOB and tightness in her chest that started yesterday.  Patient currently on antibiotic.  Patient finished her steroid medicine 2 days ago.

## 2023-06-11 NOTE — ED Provider Notes (Signed)
 HPI  SUBJECTIVE:  Alexandria Black is a 33 y.o. female who presents with constant chest tightness, substernal chest pain described as soreness, pressure and heaviness accompanied with shortness of breath, dyspnea on exertion today.  She reports nausea, diaphoresis.  She states that the shortness of breath got worse this morning.  She was doing a great deal of coughing, but less so over the past 3 to 4 days.  She has had intermittent wheezing for the past 2 weeks that returned 2 days ago.  No pleuritic pain with inspiration, calf pain, swelling, hemoptysis, smoking, exogenous estrogen, surgery in the past 4 weeks.  She states that she was in bed for "a lot" at the beginning of the illness, but not recently.  She is still on Augmentin, and finished the dexamethasone a few days ago.  She tried DayQuil earlier today.  No alleviating factors.  Symptoms are worse with exertion, standing.  She has a past medical history of childhood asthma with no admissions, and a BMI above 30.  No history of cancer, DVT, PE, exogenous estrogen, MI, coronary disease, hypercholesterolemia, hypertension, diabetes, PAD/PVD, CVA/TIA.  Family history negative for early MI.  LMP: Beginning of March.  Denies the possibility of being pregnant.  PCP: [facility].  Patient was seen here 1 week ago for shortness of breath, cough, was found to have COVID and a sinusitis.  X-ray was negative.  She was given Decadron here, sent home with Augmentin for 10 days and dexamethasone 4 mg for 5 days.  She has had COVID 4 times in the past with COVID pneumonia the first time.  Past Medical History:  Diagnosis Date   Hypertension    Obesity     Past Surgical History:  Procedure Laterality Date   ABDOMINAL SURGERY     DILATION AND CURETTAGE OF UTERUS     TONSILLECTOMY      Family History  Problem Relation Age of Onset   Crohn's disease Mother    COPD Father    Heart failure Father     Social History   Tobacco Use   Smoking  status: Never   Smokeless tobacco: Never  Vaping Use   Vaping status: Never Used  Substance Use Topics   Alcohol use: No   Drug use: Not Currently    No current facility-administered medications for this encounter.  Current Outpatient Medications:    albuterol (VENTOLIN HFA) 108 (90 Base) MCG/ACT inhaler, Inhale 2 puffs into the lungs every 4 (four) hours as needed for wheezing or shortness of breath., Disp: 1 each, Rfl: 0   ibuprofen (ADVIL) 600 MG tablet, Take 1 tablet (600 mg total) by mouth every 8 (eight) hours as needed., Disp: 30 tablet, Rfl: 0   predniSONE (STERAPRED UNI-PAK 21 TAB) 10 MG (21) TBPK tablet, Dispense one 6 day pack. Take as directed with food., Disp: 21 tablet, Rfl: 0   Spacer/Aero-Holding Chambers (AEROCHAMBER MV) inhaler, Use as instructed, Disp: 1 each, Rfl: 1   amoxicillin-clavulanate (AUGMENTIN) 875-125 MG tablet, Take 1 tablet by mouth 2 (two) times daily with a meal., Disp: 20 tablet, Rfl: 0   FLUoxetine (PROZAC) 40 MG capsule, Take 40 mg by mouth every morning., Disp: , Rfl:    hydrochlorothiazide (HYDRODIURIL) 25 MG tablet, Take 25 mg by mouth daily., Disp: , Rfl:   No Known Allergies   ROS  As noted in HPI.   Physical Exam  BP (!) 143/90 (BP Location: Left Arm)   Pulse 89  Temp 98.2 F (36.8 C) (Oral)   Resp 14   Ht 5\' 4"  (1.626 m)   Wt 132.9 kg   LMP 05/14/2023 (Approximate)   SpO2 97%   BMI 50.29 kg/m   Constitutional: Well developed, well nourished, no acute distress Eyes: PERRL, EOMI, conjunctiva normal bilaterally HENT: Normocephalic, atraumatic,mucus membranes moist Respiratory: Clear to auscultation bilaterally, no rales, no wheezing, no rhonchi.  Positive anterior reproducible chest wall tenderness. Cardiovascular: Normal rate and rhythm, no murmurs, no gallops, no rubs GI: nondistended skin: No rash, skin intact Musculoskeletal: Calves symmetric, nontender, no edema. Neurologic: Alert & oriented x 3, CN III-XII grossly  intact, no motor deficits, sensation grossly intact Psychiatric: Speech and behavior appropriate   ED Course   Medications  ipratropium-albuterol (DUONEB) 0.5-2.5 (3) MG/3ML nebulizer solution 3 mL (3 mLs Nebulization Given 06/11/23 1548)    Orders Placed This Encounter  Procedures   DG Chest 2 View    Standing Status:   Standing    Number of Occurrences:   1    Reason for Exam (SYMPTOM  OR DIAGNOSIS REQUIRED):   cough and SOB   Recheck vitals    Walking sat after duoneb please    Standing Status:   Standing    Number of Occurrences:   1   ED EKG    Standing Status:   Standing    Number of Occurrences:   1    Reason for Exam:   Shortness of breath   No results found for this or any previous visit (from the past 24 hours). DG Chest 2 View Result Date: 06/11/2023 CLINICAL DATA:  Cough, shortness of breath, chest tightness EXAM: CHEST - 2 VIEW COMPARISON:  06/04/2023 FINDINGS: The heart size and mediastinal contours are within normal limits. Both lungs are clear. The visualized skeletal structures are unremarkable. IMPRESSION: No active cardiopulmonary disease. Electronically Signed   By: Sharlet Salina M.D.   On: 06/11/2023 16:05    ED Clinical Impression  1. Bronchospasm, acute   2. COVID-19 virus infection      ED Assessment/Plan   {The patient has been seen in Urgent Care in the last 3 years. :1}  Previous records reviewed.  As noted in HPI.  EKG: Normal sinus rhythm, rate 90.  Normal axis, normal intervals.  No hypertrophy.  No ST-T wave changes.  No change compared to EKG from 02/2022. patient symptomatic while EKG was obtained  Reviewed imaging independently.  No acute cardiopulmonary disease. See radiology report for full details.  Reevaluation, patient states she feels better.  Chest tightness has significantly improved.  Lungs still clear.  Did not desaturate with walking.  I suspect bronchospasm.  Discussed that PE is very low in the differential, but still  possible.  Doubt ACS.  Sent home with a regular scheduled albuterol inhaler with a spacer for 4 days, then as needed thereafter, 6-day prednisone taper.  Tylenol/ibuprofen 3-4 times a day for chest wall pain.  Follow-up with PCP.  Strict ER return precautions given.  Discussed imaging, MDM, treatment plan, and plan for follow-up with patient Discussed sn/sx that should prompt return to the ED. patient agrees with plan.   Meds ordered this encounter  Medications   ipratropium-albuterol (DUONEB) 0.5-2.5 (3) MG/3ML nebulizer solution 3 mL   Spacer/Aero-Holding Chambers (AEROCHAMBER MV) inhaler    Sig: Use as instructed    Dispense:  1 each    Refill:  1   albuterol (VENTOLIN HFA) 108 (90 Base) MCG/ACT inhaler  Sig: Inhale 2 puffs into the lungs every 4 (four) hours as needed for wheezing or shortness of breath.    Dispense:  1 each    Refill:  0   ibuprofen (ADVIL) 600 MG tablet    Sig: Take 1 tablet (600 mg total) by mouth every 8 (eight) hours as needed.    Dispense:  30 tablet    Refill:  0   predniSONE (STERAPRED UNI-PAK 21 TAB) 10 MG (21) TBPK tablet    Sig: Dispense one 6 day pack. Take as directed with food.    Dispense:  21 tablet    Refill:  0      *This clinic note was created using Scientist, clinical (histocompatibility and immunogenetics). Therefore, there may be occasional mistakes despite careful proofreading. ?

## 2023-06-11 NOTE — Discharge Instructions (Signed)
 Your chest x-ray was negative for pneumonia.  Take two puffs from your albuterol inhaler with your spacer every 4 hours for 2 days, then every 6 hours for 2 days, then as needed. You can back off if you start to improve  sooner. Finish the steroids unless your doctor tells you to stop. Finish the antibiotics, even if you feel better. Take tylenol 1 gram combined with  600 mg of motrin up to 3-4 times a day as needed for pain. Make sure you drink extra fluids. Return to the ER if you get worse, have a fever >100.4, for any of the signs and symptoms we discussed, or any other concerns.   If the spacer is too expensive at the pharmacy, you can get an AeroChamber Z-Stat off of [employer] for about $10-$15.  Go to www.goodrx.com  or www.costplusdrugs.com to look up your medications. This will give you a list of where you can find your prescriptions at the most affordable prices. Or ask the pharmacist what the cash price is, or if they have any other discount programs available to help make your medication more affordable. This can be less expensive than what you would pay with insurance.

## 2023-11-24 DIAGNOSIS — Z419 Encounter for procedure for purposes other than remedying health state, unspecified: Secondary | ICD-10-CM | POA: Diagnosis not present

## 2023-12-25 DIAGNOSIS — M25572 Pain in left ankle and joints of left foot: Secondary | ICD-10-CM | POA: Diagnosis not present

## 2024-01-15 DIAGNOSIS — R609 Edema, unspecified: Secondary | ICD-10-CM | POA: Diagnosis not present

## 2024-01-15 DIAGNOSIS — Z6841 Body Mass Index (BMI) 40.0 and over, adult: Secondary | ICD-10-CM | POA: Diagnosis not present

## 2024-01-15 DIAGNOSIS — R030 Elevated blood-pressure reading, without diagnosis of hypertension: Secondary | ICD-10-CM | POA: Diagnosis not present

## 2024-01-15 DIAGNOSIS — F32A Depression, unspecified: Secondary | ICD-10-CM | POA: Diagnosis not present

## 2024-01-15 DIAGNOSIS — R051 Acute cough: Secondary | ICD-10-CM | POA: Diagnosis not present

## 2024-01-15 DIAGNOSIS — Z791 Long term (current) use of non-steroidal anti-inflammatories (NSAID): Secondary | ICD-10-CM | POA: Diagnosis not present

## 2024-01-15 DIAGNOSIS — F419 Anxiety disorder, unspecified: Secondary | ICD-10-CM | POA: Diagnosis not present

## 2024-01-15 DIAGNOSIS — Z13220 Encounter for screening for lipoid disorders: Secondary | ICD-10-CM | POA: Diagnosis not present

## 2024-01-15 DIAGNOSIS — Z131 Encounter for screening for diabetes mellitus: Secondary | ICD-10-CM | POA: Diagnosis not present
